# Patient Record
Sex: Female | Born: 2000 | Race: White | Hispanic: No | Smoking: Never smoker
Health system: Southern US, Community
[De-identification: ages and names within clinical notes are randomized; demographics above are authoritative.]

## PROBLEM LIST (undated history)

## (undated) DIAGNOSIS — D649 Anemia, unspecified: Secondary | ICD-10-CM

## (undated) HISTORY — DX: Anemia, unspecified: D64.9

---
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
## 2018-03-20 ENCOUNTER — Encounter (HOSPITAL_COMMUNITY): Payer: Self-pay | Admitting: Emergency Medicine

## 2018-03-20 ENCOUNTER — Emergency Department (HOSPITAL_COMMUNITY)
Admission: EM | Admit: 2018-03-20 | Discharge: 2018-03-20 | Disposition: A | Discharge status: Home / Self Care | Payer: Self-pay | Attending: Emergency Medicine | Admitting: Emergency Medicine

## 2018-03-20 DIAGNOSIS — M545 Low back pain, unspecified: Secondary | ICD-10-CM

## 2018-03-20 DIAGNOSIS — Z79899 Other long term (current) drug therapy: Secondary | ICD-10-CM | POA: Insufficient documentation

## 2018-03-20 MED ORDER — IBUPROFEN 800 MG PO TABS: 800.0000 mg | ORAL_TABLET | Freq: Three times a day (TID) | ORAL | 0 refills | Status: DC

## 2018-03-20 MED ORDER — METHOCARBAMOL 500 MG PO TABS: 500.0000 mg | ORAL_TABLET | Freq: Two times a day (BID) | ORAL | 0 refills | Status: DC

## 2018-03-20 NOTE — ED Provider Notes (Signed)
[facility] EMERGENCY DEPARTMENT Provider Note   CSN: [id number] Arrival date & time: 03/20/18  2005     History   Chief Complaint Chief Complaint  Patient presents with  . Back Pain    HPI Paige Fox is a 17 y.o. female.  The history is provided by the patient. No language interpreter was used.  Back Pain   This is a new problem. The current episode started more than 1 week ago. The problem occurs constantly. The problem has not changed since onset.The pain is present in the thoracic spine. The quality of the pain is described as aching. The pain is moderate. The pain is worse during the day. She has tried nothing for the symptoms.   Pt reports she fell 2 weeks ago.  Pt has had soreness in her back since.  History reviewed. No pertinent past medical history.  Patient Active Problem List   Diagnosis Date Noted  . VIRAL INFECTION, ACUTE 08/21/2009  . TONSILLITIS, ACUTE 08/21/2009  . ALLERGIC RHINITIS 06/24/2008    History reviewed. No pertinent surgical history.   OB History   None      Home Medications    Prior to Admission medications   Medication Sig Start Date End Date Taking? Authorizing Provider  cetirizine (ZYRTEC ALLERGY) 10 MG tablet Take 10 mg by mouth daily.    [provider]  ibuprofen (ADVIL,MOTRIN) 800 MG tablet Take 1 tablet (800 mg total) by mouth 3 (three) times daily. 03/20/18   Elson Areas, PA-C  methocarbamol (ROBAXIN) 500 MG tablet Take 1 tablet (500 mg total) by mouth 2 (two) times daily. 03/20/18   Elson Areas, PA-C    Family History History reviewed. No pertinent family history.  Social History Social History   Tobacco Use  . Smoking status: Never Smoker  . Smokeless tobacco: Never Used  Substance Use Topics  . Alcohol use: No  . Drug use: No     Allergies   Patient has no known allergies.   Review of Systems Review of Systems  Musculoskeletal: Positive for back pain.  All other systems reviewed and are  negative.    Physical Exam Updated Vital Signs BP 99/70 (BP Location: Right Arm)   Pulse 93   Temp 98.6 F (37 C) (Oral)   Resp 15   Ht 5\' 6"  (1.676 m)   Wt 95.3 kg   LMP 03/18/2018   SpO2 100%   BMI 33.89 kg/m   Physical Exam  Constitutional: She is oriented to person, place, and time. She appears well-developed and well-nourished.  HENT:  Head: Normocephalic.  Right Ear: External ear normal.  Left Ear: External ear normal.  Eyes: Pupils are equal, round, and reactive to light. EOM are normal.  Neck: Normal range of motion.  Cardiovascular: Normal rate.  Pulmonary/Chest: Effort normal.  Abdominal: Soft. She exhibits no distension.  Musculoskeletal: Normal range of motion.  Spine nontender, no bruising,  Pain lateral thoracic area,    Neurological: She is alert and oriented to person, place, and time.  Skin: Skin is warm.  Psychiatric: She has a normal mood and affect.  Nursing note and vitals reviewed.    ED Treatments / Results  Labs (all labs ordered are listed, but only abnormal results are displayed) Labs Reviewed - No data to display  EKG None  Radiology No results found.  Procedures Procedures (including critical care time)  Medications Ordered in ED Medications - No data to display   Initial Impression /  Assessment and Plan / ED Course  I have reviewed the triage vital signs and the nursing notes.  Pertinent labs & imaging results that were available during my care of the patient were reviewed by me and considered in my medical decision making (see chart for details).     MDM  I doubt fracture or spine injury.   Pt given rx for robaxin and ibuprofen for pain   Final Clinical Impressions(s) / ED Diagnoses   Final diagnoses:  Acute left-sided low back pain without sciatica    ED Discharge Orders         Ordered    methocarbamol (ROBAXIN) 500 MG tablet  2 times daily     03/20/18 2116    ibuprofen (ADVIL,MOTRIN) 800 MG tablet  3 times  daily     03/20/18 2116        An After Visit Summary was printed and given to the patient.    Elson Areas, [place] 03/20/18 2254    Maia Plan, MD 03/21/18 [phone]

## 2018-03-20 NOTE — ED Triage Notes (Signed)
Pt reports she fell on a wet floor a few weeks ago and is c/o upper back pain. Motrin at home with no relief.

## 2018-09-07 ENCOUNTER — Encounter: Payer: Self-pay | Admitting: Adult Health

## 2018-09-07 ENCOUNTER — Other Ambulatory Visit: Payer: Self-pay

## 2018-09-07 ENCOUNTER — Ambulatory Visit (INDEPENDENT_AMBULATORY_CARE_PROVIDER_SITE_OTHER): Payer: Medicaid Other | Admitting: Adult Health

## 2018-09-07 VITALS — BP 124/78 | HR 85 | Ht 66.0 in | Wt 232.0 lb

## 2018-09-07 DIAGNOSIS — Z30011 Encounter for initial prescription of contraceptive pills: Secondary | ICD-10-CM

## 2018-09-07 DIAGNOSIS — Z3202 Encounter for pregnancy test, result negative: Secondary | ICD-10-CM

## 2018-09-07 LAB — POCT URINE PREGNANCY: PREG TEST UR: NEGATIVE

## 2018-09-07 MED ORDER — NORETHIN ACE-ETH ESTRAD-FE 1-20 MG-MCG(24) PO CAPS
1.0000 | ORAL_CAPSULE | Freq: Every day | ORAL | 12 refills | Status: DC
Start: 2018-09-07 — End: 2019-01-11

## 2018-09-07 NOTE — Patient Instructions (Signed)
Oral Contraception Use  Oral contraceptive pills (OCPs) are medicines that you take to prevent pregnancy. OCPs work by:  · Preventing the ovaries from releasing eggs.  · Thickening mucus in the lower part of the uterus (cervix), which prevents sperm from entering the uterus.  · Thinning the lining of the uterus (endometrium), which prevents a fertilized egg from attaching to the endometrium.  OCPs are highly effective when taken exactly as prescribed. However, OCPs do not prevent sexually transmitted infections (STIs). Safe sex practices, such as using condoms while on an OCP, can help prevent STIs.  Before taking OCPs, you may have a physical exam, blood test, and Pap test. A Pap test involves taking a sample of cells from your cervix to check for cancer. Discuss with your health care provider the possible side effects of the OCP you may be prescribed. When you start an OCP, be aware that it can take 2-3 months for your body to adjust to changes in hormone levels.  How to take oral contraceptive pills  Follow instructions from your health care provider about how to start taking your first cycle of OCPs. Your health care provider may recommend that you:  · Start the pill on day 1 of your menstrual period. If you start at this time, you will not need any backup form of birth control (contraception), such as condoms.  · Start the pill on the first Sunday after your menstrual period or on the day you get your prescription. In these cases, you will need to use backup contraception for the first week.  · Start the pill at any time of your cycle.  ? If you take the pill within 5 days of the start of your period, you will not need a backup form of contraception.  ? If you start at any other time of your menstrual cycle, you will need to use another form of contraception for 7 days. If your OCP is the type called a minipill, it will protect you from pregnancy after taking it for 2 days (48 hours), and you can stop using  backup contraception after that time.  After you have started taking OCPs:  · If you forget to take 1 pill, take it as soon as you remember. Take the next pill at the regular time.  · If you miss 2 or more pills, call your health care provider. Different pills have different instructions for missed doses. Use backup birth control until your next menstrual period starts.  · If you use a 28-day pack that contains inactive pills and you miss 1 of the last 7 pills (pills with no hormones), throw away the rest of the non-hormone pills and start a new pill pack.  No matter which day you start the OCP, you will always start a new pack on that same day of the week. Have an extra pack of OCPs and a backup contraceptive method available in case you miss some pills or lose your OCP pack.  Follow these instructions at home:  · Do not use any products that contain nicotine or tobacco, such as cigarettes and e-cigarettes. If you need help quitting, ask your health care provider.  · Always use a condom to protect against STIs. OCPs do not protect against STIs.  · Use a calendar to mark the days of your menstrual period.  · Read the information and directions that came with your OCP. Talk to your health care provider if you have questions.  Contact a   health care provider if:  · You develop nausea and vomiting.  · You have abnormal vaginal discharge or bleeding.  · You develop a rash.  · You miss your menstrual period. Depending on the type of OCP you are taking, this may be a sign of pregnancy. Ask your health care provider for more information.  · You are losing your hair.  · You need treatment for mood swings or depression.  · You get dizzy when taking the OCP.  · You develop acne after taking the OCP.  · You become pregnant or think you may be pregnant.  · You have diarrhea, constipation, and abdominal pain or cramps.  · You miss 2 or more pills.  Get help right away if:  · You develop chest pain.  · You develop shortness of  breath.  · You have an uncontrolled or severe headache.  · You develop numbness or slurred speech.  · You develop visual or speech problems.  · You develop pain, redness, and swelling in your legs.  · You develop weakness or numbness in your arms or legs.  Summary  · Oral contraceptive pills (OCPs) are medicines that you take to prevent pregnancy.  · OCPs do not prevent sexually transmitted infections (STIs). Always use a condom to protect against STIs.  · When you start an OCP, be aware that it can take 2-3 months for your body to adjust to changes in hormone levels.  · Read all the information and directions that come with your OCP.  This information is not intended to replace advice given to you by your health care provider. Make sure you discuss any questions you have with your health care provider.  Document Released: 06/23/2011 Document Revised: 08/15/2016 Document Reviewed: 08/15/2016  Elsevier Interactive Patient Education © 2019 [employer] Inc[employer]

## 2018-09-07 NOTE — Progress Notes (Signed)
Patient ID: Paige Fox, female   DOB: January 18, 2001, 18 y.o.   MRN: [id number] History of Present Illness: Paige Fox is a 18 year old Hispanic female in for UPT, she was eating a lot and had + and negative HPT, and her mom wanted another UPT. PCP is [facility]. In [place].    Current Medications, Allergies, Past Medical History, Past Surgical History, Family History and Social History were reviewed in [employer] record.     Review of Systems:  Patient denies any daily. headaches, hearing loss, fatigue, blurred vision, shortness of breath, chest pain, abdominal pain, problems with bowel movements, urination, or intercourse. No joint pain or mood swings. Periods last 5 days, first couple heavy, occasional cramps, changes pads every 6 hours.   Physical Exam:BP 124/78 (BP Location: Right Arm, Patient Position: Sitting, Cuff Size: Large)   Pulse 85   Ht 5\' 6"  (1.676 m)   Wt 232 lb (105.2 kg)   LMP 08/22/2018 (Approximate)   BMI 37.45 kg/m   UPT negative. General:  Well developed, well nourished, no acute distress Skin:  Warm and dry Neck:  Midline trachea, normal thyroid, good ROM, no lymphadenopathy Lungs; Clear to auscultation bilaterally Cardiovascular: Regular rate and rhythm Psych:  No mood changes, alert and cooperative,seems happy Fall risk is low. PHQ 2 score 1. Denies migraines with aura, DVT, stroke, MI, or breast cancer. Discussed OCs and depo, and nexplanon, ring and IUD and wants to try OCs, has use depo in past, with weight gain. Will rx Taytulla, to start today,1 pack given, take at   Impression:  1. Encounter for initial prescription of contraceptive pills   2. Negative pregnancy test      Plan: Meds ordered this encounter  Medications  . Norethin Ace-Eth Estrad-FE (TAYTULLA) 1-20 MG-MCG(24) CAPS    Sig: Take 1 tablet by mouth daily.    Dispense:  28 capsule    Refill:  12    Order Specific Question:   Supervising Provider    Answer:    Lazaro Arms [[id number]]  Start today Use condoms F/U with me in 3 months  Review handout on OC use

## 2018-11-23 ENCOUNTER — Telehealth: Payer: Self-pay | Admitting: *Deleted

## 2018-11-23 NOTE — Telephone Encounter (Signed)
Left message at both numbers listed letting pt know no visitors or children at [facility] appt. Also, if pt has come in contact with someone in the last month that has been confirmed or suspected of having Covid-19 or if she is experiencing any symptoms herself, please don't come to the office; just call and appt can be rescheduled. JSY

## 2018-11-26 ENCOUNTER — Ambulatory Visit: Payer: Self-pay | Admitting: Adult Health

## 2018-11-29 ENCOUNTER — Other Ambulatory Visit: Payer: Self-pay

## 2018-11-29 ENCOUNTER — Ambulatory Visit (INDEPENDENT_AMBULATORY_CARE_PROVIDER_SITE_OTHER): Payer: Medicaid Other | Admitting: Adult Health

## 2018-11-29 ENCOUNTER — Encounter: Payer: Self-pay | Admitting: Adult Health

## 2018-11-29 VITALS — BP 137/80 | HR 86 | Temp 98.5°F | Ht 66.0 in | Wt 230.6 lb

## 2018-11-29 DIAGNOSIS — Z3041 Encounter for surveillance of contraceptive pills: Secondary | ICD-10-CM | POA: Diagnosis not present

## 2018-11-29 NOTE — Progress Notes (Signed)
Patient ID: Paige Fox, female   DOB: [birth year], 18 y.o.   MRN: [id number] History of Present Illness: Paige Fox is a 18 year old white female, single,G0P0 back in follow up on starting Taytulla in February and she likes them, periods shorter and lighter. She is still working at [employer].  PCP is [facility] in [place].    Current Medications, Allergies, Past Medical History, Past Surgical History, Family History and Social History were reviewed in [employer] record.     Review of Systems: Patient denies any headaches, hearing loss, fatigue, blurred vision, shortness of breath, chest pain, abdominal pain, problems with bowel movements, urination, or intercourse. No joint pain or mood swings. Periods are shorter and lighter.     Physical Exam:BP 137/80 (BP Location: Right Arm, Patient Position: Sitting, Cuff Size: Normal)   Pulse 86   Temp 98.5 F (36.9 C)   Ht 5\' 6"  (1.676 m)   Wt 230 lb 9.6 oz (104.6 kg)   LMP 11/16/2018   BMI 37.22 kg/m  General:  Well developed, well nourished, no acute distress Skin:  Warm and dry Lungs; Clear to auscultation bilaterally Cardiovascular: Regular rate and rhythm Psych:  No mood changes, alert and cooperative,seems happy Fall risk is low. Will continue Taytulla.    Impression and Plan: 1. Encounter for surveillance of contraceptive pills -continue Taytulla, has refills -use condoms Follow  Up in February 2021

## 2019-01-11 ENCOUNTER — Other Ambulatory Visit: Payer: Self-pay | Admitting: Adult Health

## 2019-07-30 DIAGNOSIS — Z20822 Contact with and (suspected) exposure to covid-19: Secondary | ICD-10-CM | POA: Diagnosis not present

## 2020-01-27 ENCOUNTER — Other Ambulatory Visit: Payer: Self-pay | Admitting: Adult Health

## 2020-04-21 ENCOUNTER — Encounter: Payer: Self-pay | Admitting: Adult Health

## 2020-04-21 ENCOUNTER — Ambulatory Visit (INDEPENDENT_AMBULATORY_CARE_PROVIDER_SITE_OTHER): Payer: Medicaid Other | Admitting: Adult Health

## 2020-04-21 VITALS — BP 130/91 | HR 115 | Ht 66.0 in | Wt 264.0 lb

## 2020-04-21 DIAGNOSIS — Z30011 Encounter for initial prescription of contraceptive pills: Secondary | ICD-10-CM | POA: Diagnosis not present

## 2020-04-21 DIAGNOSIS — Z3202 Encounter for pregnancy test, result negative: Secondary | ICD-10-CM | POA: Diagnosis not present

## 2020-04-21 LAB — POCT URINE PREGNANCY: Preg Test, Ur: NEGATIVE

## 2020-04-21 MED ORDER — LO LOESTRIN FE 1 MG-10 MCG / 10 MCG PO TABS: 1.0000 | ORAL_TABLET | Freq: Every day | ORAL | 11 refills | Status: DC

## 2020-04-21 NOTE — Progress Notes (Signed)
°  Subjective:     Patient ID: Paige Fox, female   DOB: May 26, 2001, 19 y.o.   MRN: [id number]  HPI Paige Fox is a 19 year old white female, single, G0P0, in to discuss getting back on birth control pills.   Review of Systems Patient denies any headaches, hearing loss, fatigue, blurred vision, shortness of breath, chest pain, abdominal pain, problems with bowel movements, urination, or intercourse. No joint pain or mood swings. Reviewed past medical,surgical, social and family history. Reviewed medications and allergies.     Objective:   Physical Exam BP (!) 130/91 (BP Location: Left Wrist, Patient Position: Sitting, Cuff Size: Normal)    Pulse (!) 115    Ht 5\' 6"  (1.676 m)    Wt 264 lb (119.7 kg)    LMP 03/26/2020 (Exact Date)    BMI 42.61 kg/m UPT is negative Skin warm and dry. Neck: mid line trachea, normal thyroid, good ROM, no lymphadenopathy noted. Lungs: clear to ausculation bilaterally. Cardiovascular: regular rate and rhythm.  Upstream - 04/21/20 1214      Pregnancy Intention Screening   Does the patient want to become pregnant in the next year? No    Does the patient's partner want to become pregnant in the next year? No    Would the patient like to discuss contraceptive options today? Yes      Contraception Wrap Up   Current Method No Contraceptive Precautions    End Method Oral Contraceptive    Contraception Counseling Provided No             Assessment:     1. Urine pregnancy test negative   2. Encounter for initial prescription of contraceptive pills Start lo loestrin with next period and use back up for 1 pack Meds ordered this encounter  Medications   Norethindrone-Ethinyl Estradiol-Fe Biphas (LO LOESTRIN FE) 1 MG-10 MCG / 10 MCG tablet    Sig: Take 1 tablet by mouth daily. Take 1 daily by mouth    Dispense:  28 tablet    Refill:  11    BIN 06/21/20, PCN CN, GRP [id number]    Order Specific Question:   Supervising Provider    Answer:   [id number] [[id number]]      Plan:     Return in about 10 weeks for ROS and pap and physical with me

## 2020-06-29 ENCOUNTER — Other Ambulatory Visit: Payer: Medicaid Other | Admitting: Adult Health

## 2020-08-13 ENCOUNTER — Other Ambulatory Visit: Payer: Medicaid Other | Admitting: Adult Health

## 2020-09-24 ENCOUNTER — Other Ambulatory Visit: Payer: Medicaid Other | Admitting: Adult Health

## 2021-01-12 ENCOUNTER — Ambulatory Visit
Admission: EM | Admit: 2021-01-12 | Discharge: 2021-01-12 | Disposition: A | Discharge status: Home / Self Care | Payer: Medicaid Other | Attending: Physician Assistant | Admitting: Physician Assistant

## 2021-01-12 ENCOUNTER — Other Ambulatory Visit: Payer: Self-pay

## 2021-01-12 ENCOUNTER — Encounter: Payer: Self-pay | Admitting: Emergency Medicine

## 2021-01-12 DIAGNOSIS — R42 Dizziness and giddiness: Secondary | ICD-10-CM

## 2021-01-12 LAB — POCT FASTING CBG KUC MANUAL ENTRY: POCT Glucose (KUC): 116 mg/dL — AB (ref 70–99)

## 2021-01-12 NOTE — ED Triage Notes (Signed)
Pt was having dizzy spells yesterday at work constantly.  State she has had trouble with dizziness since middle school.

## 2021-01-12 NOTE — Discharge Instructions (Addendum)
Buy a multivitamin with iron  Your labs are pending

## 2021-01-13 LAB — CBC WITH DIFFERENTIAL/PLATELET
Basophils Absolute: 0.1 x10E3/uL (ref 0.0–0.2)
Basos: 0 %
EOS (ABSOLUTE): 0.1 x10E3/uL (ref 0.0–0.4)
Eos: 1 %
Hematocrit: 41.6 % (ref 34.0–46.6)
Hemoglobin: 14 g/dL (ref 11.1–15.9)
Immature Grans (Abs): 0 x10E3/uL (ref 0.0–0.1)
Immature Granulocytes: 0 %
Lymphocytes Absolute: 2.8 x10E3/uL (ref 0.7–3.1)
Lymphs: 22 %
MCH: 29 pg (ref 26.6–33.0)
MCHC: 33.7 g/dL (ref 31.5–35.7)
MCV: 86 fL (ref 79–97)
Monocytes Absolute: 1 x10E3/uL — ABNORMAL HIGH (ref 0.1–0.9)
Monocytes: 8 %
Neutrophils Absolute: 8.5 x10E3/uL — ABNORMAL HIGH (ref 1.4–7.0)
Neutrophils: 69 %
Platelets: 414 x10E3/uL (ref 150–450)
RBC: 4.82 x10E6/uL (ref 3.77–5.28)
RDW: 12.1 % (ref 11.7–15.4)
WBC: 12.4 x10E3/uL — ABNORMAL HIGH (ref 3.4–10.8)

## 2021-01-13 LAB — BASIC METABOLIC PANEL WITH GFR
BUN/Creatinine Ratio: 11 (ref 9–23)
BUN: 9 mg/dL (ref 6–20)
CO2: 22 mmol/L (ref 20–29)
Calcium: 9.8 mg/dL (ref 8.7–10.2)
Chloride: 102 mmol/L (ref 96–106)
Creatinine, Ser: 0.81 mg/dL (ref 0.57–1.00)
Glucose: 87 mg/dL (ref 65–99)
Potassium: 4.4 mmol/L (ref 3.5–5.2)
Sodium: 142 mmol/L (ref 134–144)
eGFR: 105 mL/min/1.73

## 2021-01-15 NOTE — ED Provider Notes (Addendum)
[facility]    CSN: [id number] Arrival date & time: 01/12/21  1418      History   Chief Complaint Chief Complaint  Patient presents with   Dizziness    HPI Paige Fox is a 20 y.o. female.   The history is provided by the patient. No language interpreter was used.  Dizziness Quality:  Lightheadedness Severity:  Moderate Onset quality:  Gradual Duration:  1 week Chronicity:  New Relieved by:  Nothing Worsened by:  Nothing Ineffective treatments:  None tried Associated symptoms: no nausea   Risk factors: anemia   Pt has a history of anemia.  Pt reports she has recently been feeling dizzy.  Pt has had this in the past  History reviewed. No pertinent past medical history.  Patient Active Problem List   Diagnosis Date Noted   Encounter for initial prescription of contraceptive pills 04/21/2020   Urine pregnancy test negative 04/21/2020   VIRAL INFECTION, ACUTE 08/21/2009   TONSILLITIS, ACUTE 08/21/2009   ALLERGIC RHINITIS 06/24/2008    History reviewed. No pertinent surgical history.  OB History     Gravida  0   Para  0   Term  0   Preterm  0   AB  0   Living  0      SAB  0   IAB  0   Ectopic  0   Multiple  0   Live Births  0            Home Medications    Prior to Admission medications   Medication Sig Start Date End Date Taking? Authorizing Provider  cetirizine (ZYRTEC ALLERGY) 10 MG tablet Take 10 mg by mouth daily.    [provider]  ibuprofen (ADVIL,MOTRIN) 800 MG tablet Take 1 tablet (800 mg total) by mouth 3 (three) times daily. 03/20/18   Elson Areas, PA-C  Norethindrone-Ethinyl Estradiol-Fe Biphas (LO LOESTRIN FE) 1 MG-10 MCG / 10 MCG tablet Take 1 tablet by mouth daily. Take 1 daily by mouth 04/21/20   Adline Potter, NP    Family History Family History  Problem Relation Age of Onset   Hypertension Maternal Grandmother    Heart disease Maternal Grandmother    Diabetes Mother    Cancer Mother      Social History Social History   Tobacco Use   Smoking status: Never   Smokeless tobacco: Never  Vaping Use   Vaping Use: Never used  Substance Use Topics   Alcohol use: No   Drug use: No     Allergies   Patient has no known allergies.   Review of Systems Review of Systems  Gastrointestinal:  Negative for nausea.  Neurological:  Positive for dizziness.  All other systems reviewed and are negative.   Physical Exam Triage Vital Signs ED Triage Vitals [01/12/21 1449]  Enc Vitals Group     BP 139/89     Pulse Rate 97     Resp 19     Temp 98.8 F (37.1 C)     Temp Source Oral     SpO2 96 %     Weight      Height      Head Circumference      Peak Flow      Pain Score 0     Pain Loc      Pain Edu?      Excl. in GC?    No data found.  Updated Vital Signs BP  139/89 (BP Location: Right Arm)   Pulse 97   Temp 98.8 F (37.1 C) (Oral)   Resp 19   LMP 12/06/2020   SpO2 96%   Visual Acuity Right Eye Distance:   Left Eye Distance:   Bilateral Distance:    Right Eye Near:   Left Eye Near:    Bilateral Near:     Physical Exam Vitals and nursing note reviewed.  Constitutional:      Appearance: She is well-developed.  HENT:     Head: Normocephalic.     Nose: Nose normal.     Mouth/Throat:     Mouth: Mucous membranes are moist.  Cardiovascular:     Rate and Rhythm: Normal rate and regular rhythm.  Pulmonary:     Effort: Pulmonary effort is normal.  Abdominal:     General: Abdomen is flat. There is no distension.  Musculoskeletal:        General: Normal range of motion.     Cervical back: Normal range of motion.  Skin:    General: Skin is warm.  Neurological:     General: No focal deficit present.     Mental Status: She is alert and oriented to person, place, and time.  Psychiatric:        Mood and Affect: Mood normal.     UC Treatments / Results  Labs (all labs ordered are listed, but only abnormal results are displayed) Labs Reviewed   CBC WITH DIFFERENTIAL/PLATELET - Abnormal; Notable for the following components:      Result Value   WBC 12.4 (*)    Neutrophils Absolute 8.5 (*)    Monocytes Absolute 1.0 (*)    All other components within normal limits   Narrative:    Performed at:  [address] [place], [place]  [id number] Lab Director: Jolene Schimke MD, Phone:  [phone]  POCT FASTING CBG [facility] MANUAL ENTRY - Abnormal; Notable for the following components:   POCT Glucose ([facility]) 116 (*)    All other components within normal limits  BASIC METABOLIC PANEL   Narrative:    Performed at:  [address], [place], [place]  [id number] Lab Director: Jolene Schimke MD, Phone:  [phone]    EKG   Radiology No results found.  Procedures Procedures (including critical care time)  Medications Ordered in UC Medications - No data to display  Initial Impression / Assessment and Plan / UC Course  I have reviewed the triage vital signs and the nursing notes.  Pertinent labs & imaging results that were available during my care of the patient were reviewed by me and considered in my medical decision making (see chart for details).     MDM;  I will send labs.  Pt advised to start multivitamin with iron.  Follow up with primary care MD>  Increase fluids  Labs reviewed,  o need for further treatment  Final Clinical Impressions(s) / UC Diagnoses   Final diagnoses:  Dizziness and giddiness     Discharge Instructions      Buy a multivitamin with iron  Your labs are pending    ED Prescriptions   None    PDMP not reviewed this encounter. An After Visit Summary was printed and given to the patient.    Elson Areas, PA-C 01/15/21 1617    Elson Areas, [place] 01/20/21 1958

## 2021-01-16 ENCOUNTER — Ambulatory Visit (INDEPENDENT_AMBULATORY_CARE_PROVIDER_SITE_OTHER): Payer: Medicaid Other

## 2021-01-16 ENCOUNTER — Ambulatory Visit
Admission: EM | Admit: 2021-01-16 | Discharge: 2021-01-16 | Disposition: A | Discharge status: Home / Self Care | Payer: Medicaid Other | Attending: Emergency Medicine | Admitting: Emergency Medicine

## 2021-01-16 ENCOUNTER — Other Ambulatory Visit: Payer: Self-pay

## 2021-01-16 DIAGNOSIS — M25561 Pain in right knee: Secondary | ICD-10-CM

## 2021-01-16 DIAGNOSIS — S8991XA Unspecified injury of right lower leg, initial encounter: Secondary | ICD-10-CM | POA: Diagnosis not present

## 2021-01-16 MED ORDER — NAPROXEN 500 MG PO TABS: 500.0000 mg | ORAL_TABLET | Freq: Two times a day (BID) | ORAL | 0 refills | Status: DC

## 2021-01-16 NOTE — ED Provider Notes (Signed)
[facility]   [id number] 01/16/21 Arrival Time: 1418  CC: RT knee PAIN  SUBJECTIVE: History from: patient. Paige Fox is a 20 y.o. female complains of RT knee pain x 1 day  States she was pivoting and felt pop in knee.  Localizes the pain to the front of knee.  Describes the pain as intermittent.  Denies alleviating factors.  Symptoms are made worse with bending knee.  Denies similar symptoms in the past.  Denies fever, chills, erythema, ecchymosis, effusion, weakness, numbness and tingling.  ROS: As per HPI.  All other pertinent ROS negative.     History reviewed. No pertinent past medical history. History reviewed. No pertinent surgical history. No Known Allergies No current facility-administered medications on file prior to encounter.   Current Outpatient Medications on File Prior to Encounter  Medication Sig Dispense Refill   ibuprofen (ADVIL,MOTRIN) 800 MG tablet Take 1 tablet (800 mg total) by mouth 3 (three) times daily. 21 tablet 0   Norethindrone-Ethinyl Estradiol-Fe Biphas (LO LOESTRIN FE) 1 MG-10 MCG / 10 MCG tablet Take 1 tablet by mouth daily. Take 1 daily by mouth 28 tablet 11   Social History   Socioeconomic History   Marital status: Single    Spouse name: Not on file   Number of children: 0   Years of education: Not on file   Highest education level: Not on file  Occupational History   Not on file  Tobacco Use   Smoking status: Never   Smokeless tobacco: Never  Vaping Use   Vaping Use: Never used  Substance and Sexual Activity   Alcohol use: No   Drug use: No   Sexual activity: Yes    Birth control/protection: None  Other Topics Concern   Not on file  Social History Narrative   Not on file   Social Determinants of Health   Financial Resource Strain: Not on file  Food Insecurity: Not on file  Transportation Needs: Not on file  Physical Activity: Not on file  Stress: Not on file  Social Connections: Not on file  Intimate Partner Violence:  Not on file   Family History  Problem Relation Age of Onset   Hypertension Maternal Grandmother    Heart disease Maternal Grandmother    Diabetes Mother    Cancer Mother     OBJECTIVE:  Vitals:   01/16/21 1424  BP: 134/86  Pulse: (!) 113  Resp: 16  Temp: 98.5 F (36.9 C)  TempSrc: Oral  SpO2: 95%    General appearance: ALERT; in no acute distress.  Head: NCAT Lungs: Normal respiratory effort Musculoskeletal: RT knee Inspection: Skin warm, dry, clear and intact without obvious erythema, effusion, or ecchymosis.  Palpation: mildly TTP over medial aspect of knee ROM: LROM Strength: 5/5 knee flexion, 5/5 knee extension Negative anterior/ posterior drawer Skin: warm and dry Neurologic: Ambulates without difficulty; Sensation intact about the lower extremities Psychological: alert and cooperative; normal mood and affect  DIAGNOSTIC STUDIES:  DG Knee Complete 4 Views Right  Result Date: 01/16/2021 CLINICAL DATA:  Knee injury.  Heard a pop sound. EXAM: RIGHT KNEE - COMPLETE 4+ VIEW COMPARISON:  09/11/2016 FINDINGS: No evidence of fracture, dislocation, or joint effusion. No evidence of arthropathy or other focal bone abnormality. Soft tissues are unremarkable. IMPRESSION: Negative. Electronically Signed   By: Norva Pavlov M.D.   On: 01/16/2021 14:45     X-rays negative for bony abnormalities including fracture, or dislocation.  No soft tissue swelling.    I  have reviewed the x-rays myself and the radiologist interpretation. I am in agreement with the radiologist interpretation.     ASSESSMENT & PLAN:  1. Acute pain of right knee     Meds ordered this encounter  Medications   naproxen (NAPROSYN) 500 MG tablet    Sig: Take 1 tablet (500 mg total) by mouth 2 (two) times daily.    Dispense:  30 tablet    Refill:  0    Order Specific Question:   Supervising Provider    Answer:   Eustace Moore [[id number]]   X-rays negative Ace applied Continue conservative  management of rest, ice, elevation, and gentle stretches Take naproxen as needed for pain relief (may cause abdominal discomfort, ulcers, and GI bleeds avoid taking with other NSAIDs) Follow up with PCP if symptoms persist Return or go to the ER if you have any new or worsening symptoms (fever, chills, chest pain, redness, swelling, deformity, etc...)   Reviewed expectations re: course of current medical issues. Questions answered. Outlined signs and symptoms indicating need for more acute intervention. Patient verbalized understanding. After Visit Summary given.     Rennis Harding, PA-C 01/16/21 1458

## 2021-01-16 NOTE — ED Triage Notes (Signed)
Patient presents to Urgent Care with complaints of a right knee injury. She states she was cleaning her room and heard a pop sound. Reports pain increases with movement.

## 2021-01-16 NOTE — Discharge Instructions (Addendum)
X-rays negative Ace applied Continue conservative management of rest, ice, elevation, and gentle stretches Take naproxen as needed for pain relief (may cause abdominal discomfort, ulcers, and GI bleeds avoid taking with other NSAIDs) Follow up with PCP if symptoms persist Return or go to the ER if you have any new or worsening symptoms (fever, chills, chest pain, redness, swelling, deformity, etc...)

## 2021-02-11 ENCOUNTER — Ambulatory Visit
Admission: EM | Admit: 2021-02-11 | Discharge: 2021-02-11 | Disposition: A | Discharge status: Home / Self Care | Payer: Medicaid Other | Attending: Emergency Medicine | Admitting: Emergency Medicine

## 2021-02-11 ENCOUNTER — Other Ambulatory Visit: Payer: Self-pay

## 2021-02-11 DIAGNOSIS — R21 Rash and other nonspecific skin eruption: Secondary | ICD-10-CM

## 2021-02-11 DIAGNOSIS — T7840XA Allergy, unspecified, initial encounter: Secondary | ICD-10-CM

## 2021-02-11 MED ORDER — TRIAMCINOLONE ACETONIDE 0.1 % EX CREA: 1.0000 "application " | TOPICAL_CREAM | Freq: Two times a day (BID) | CUTANEOUS | 0 refills | Status: DC

## 2021-02-11 NOTE — ED Provider Notes (Signed)
[facility]   [id number] 02/11/21 Arrival Time: 1431  CC: SKIN COMPLAINT  SUBJECTIVE:  Paige Fox is a 20 y.o. female who presents with a rash around neck x 1-2 weeks.  Denies precipitating event or trauma.  Denies changes in soaps, detergents, close contacts with similar rash, known trigger or environmental trigger, allergy. Does admit to wearing a sterling silver necklace with low dose nickel.  Took off 1-2 days ago.  Localizes the rash to the neck.  Describes it as painful, itchy, and burning.  Denies alleviating factors  She denies aggravating factors.  Reports similar symptoms in the past with nickel.   Denies fever, chills, nausea, vomiting.  ROS: As per HPI.  All other pertinent ROS negative.     History reviewed. No pertinent past medical history. History reviewed. No pertinent surgical history. No Known Allergies No current facility-administered medications on file prior to encounter.   Current Outpatient Medications on File Prior to Encounter  Medication Sig Dispense Refill   ibuprofen (ADVIL,MOTRIN) 800 MG tablet Take 1 tablet (800 mg total) by mouth 3 (three) times daily. 21 tablet 0   naproxen (NAPROSYN) 500 MG tablet Take 1 tablet (500 mg total) by mouth 2 (two) times daily. 30 tablet 0   Norethindrone-Ethinyl Estradiol-Fe Biphas (LO LOESTRIN FE) 1 MG-10 MCG / 10 MCG tablet Take 1 tablet by mouth daily. Take 1 daily by mouth 28 tablet 11   Social History   Socioeconomic History   Marital status: Single    Spouse name: Not on file   Number of children: 0   Years of education: Not on file   Highest education level: Not on file  Occupational History   Not on file  Tobacco Use   Smoking status: Never   Smokeless tobacco: Never  Vaping Use   Vaping Use: Never used  Substance and Sexual Activity   Alcohol use: No   Drug use: No   Sexual activity: Yes    Birth control/protection: None  Other Topics Concern   Not on file  Social History Narrative   Not  on file   Social Determinants of Health   Financial Resource Strain: Not on file  Food Insecurity: Not on file  Transportation Needs: Not on file  Physical Activity: Not on file  Stress: Not on file  Social Connections: Not on file  Intimate Partner Violence: Not on file   Family History  Problem Relation Age of Onset   Hypertension Maternal Grandmother    Heart disease Maternal Grandmother    Diabetes Mother    Cancer Mother     OBJECTIVE: Vitals:   02/11/21 1441 02/11/21 1443  BP:  117/77  Pulse:  (!) 102  Temp:  98.5 F (36.9 C)  SpO2:  96%  Weight: 263 lb (119.3 kg)   Height: 5\' 2"  (1.575 m)     General appearance: alert; no distress Head: NCAT Lungs: normal respiratory effort Extremities: no edema Skin: warm and dry; erythematous papular rash to circumference of neck, NTTP, no active drainage or bleeding Psychological: alert and cooperative; normal mood and affect  ASSESSMENT & PLAN:  1. Rash of neck   2. Allergic reaction, initial encounter     Meds ordered this encounter  Medications   triamcinolone cream (KENALOG) 0.1 %    Sig: Apply 1 application topically 2 (two) times daily.    Dispense:  30 g    Refill:  0    Order Specific Question:   Supervising Provider  AnswerEustace Moore [[id number]]    @[facility] @  Prescribed triamcinolone use as directed Follow up with PCP for recheck Return or go to the ER if you have any new or worsening symptoms such as fever, chills, nausea, vomiting, redness, swelling, discharge, if symptoms do not improve with medications, etc...  Reviewed expectations re: course of current medical issues. Questions answered. Outlined signs and symptoms indicating need for more acute intervention. Patient verbalized understanding. After Visit Summary given.    , PA-C 02/11/21 [phone]

## 2021-02-11 NOTE — Discharge Instructions (Addendum)
Prescribed triamcinolone use as directed Follow up with PCP for recheck Return or go to the ER if you have any new or worsening symptoms such as fever, chills, nausea, vomiting, redness, swelling, discharge, if symptoms do not improve with medications, etc..Marland Kitchen

## 2021-02-11 NOTE — ED Triage Notes (Signed)
Pt states that she has a rash that goes around her neck that burns and itches. X1 week

## 2021-02-22 ENCOUNTER — Other Ambulatory Visit: Payer: Self-pay

## 2021-02-22 ENCOUNTER — Ambulatory Visit
Admission: EM | Admit: 2021-02-22 | Discharge: 2021-02-22 | Disposition: A | Discharge status: Home / Self Care | Payer: Medicaid Other | Attending: Family Medicine | Admitting: Family Medicine

## 2021-02-22 ENCOUNTER — Encounter: Payer: Self-pay | Admitting: Emergency Medicine

## 2021-02-22 DIAGNOSIS — B349 Viral infection, unspecified: Secondary | ICD-10-CM

## 2021-02-22 DIAGNOSIS — Z20822 Contact with and (suspected) exposure to covid-19: Secondary | ICD-10-CM | POA: Diagnosis not present

## 2021-02-22 MED ORDER — PROMETHAZINE-DM 6.25-15 MG/5ML PO SYRP: 5.0000 mL | ORAL_SOLUTION | Freq: Three times a day (TID) | ORAL | 0 refills | Status: DC | PRN

## 2021-02-22 NOTE — ED Provider Notes (Signed)
[facility]    CSN: [id number] Arrival date & time: 02/22/21  1253      History   Chief Complaint No chief complaint on file.   HPI Paige Fox is a 20 y.o. female.   HPI Patient presents today for evaluation of sore throat, subjective fever, headache and body aches x1 day ago.  She denies any known exposure to anyone positive for COVID.  Denies any shortness of breath. Take OTC ibuprofen and Flu cold medication without relief.   History reviewed. No pertinent past medical history.  Patient Active Problem List   Diagnosis Date Noted   Encounter for initial prescription of contraceptive pills 04/21/2020   Urine pregnancy test negative 04/21/2020   VIRAL INFECTION, ACUTE 08/21/2009   TONSILLITIS, ACUTE 08/21/2009   ALLERGIC RHINITIS 06/24/2008    History reviewed. No pertinent surgical history.  OB History     Gravida  0   Para  0   Term  0   Preterm  0   AB  0   Living  0      SAB  0   IAB  0   Ectopic  0   Multiple  0   Live Births  0            Home Medications    Prior to Admission medications   Medication Sig Start Date End Date Taking? Authorizing Provider  promethazine-dextromethorphan (PROMETHAZINE-DM) 6.25-15 MG/5ML syrup Take 5 mLs by mouth 3 (three) times daily as needed for cough. 02/22/21  Yes Bing Neighbors, FNP  ibuprofen (ADVIL,MOTRIN) 800 MG tablet Take 1 tablet (800 mg total) by mouth 3 (three) times daily. 03/20/18   Elson Areas, PA-C  naproxen (NAPROSYN) 500 MG tablet Take 1 tablet (500 mg total) by mouth 2 (two) times daily. 01/16/21   Wurst, [place], PA-C  Norethindrone-Ethinyl Estradiol-Fe Biphas (LO LOESTRIN FE) 1 MG-10 MCG / 10 MCG tablet Take 1 tablet by mouth daily. Take 1 daily by mouth 04/21/20   Cyril Mourning A, NP  triamcinolone cream (KENALOG) 0.1 % Apply 1 application topically 2 (two) times daily. 02/11/21   Rennis Harding, PA-C    Family History Family History  Problem Relation Age of Onset    Hypertension Maternal Grandmother    Heart disease Maternal Grandmother    Diabetes Mother    Cancer Mother     Social History Social History   Tobacco Use   Smoking status: Never   Smokeless tobacco: Never  Vaping Use   Vaping Use: Never used  Substance Use Topics   Alcohol use: No   Drug use: No     Allergies   Patient has no known allergies.   Review of Systems Review of Systems Pertinent negatives listed in HPI   Physical Exam Triage Vital Signs ED Triage Vitals  Enc Vitals Group     BP 02/22/21 1324 115/84     Pulse Rate 02/22/21 1324 (!) 102     Resp 02/22/21 1324 16     Temp 02/22/21 1324 98.6 F (37 C)     Temp Source 02/22/21 1324 Oral     SpO2 02/22/21 1324 96 %     Weight --      Height --      Head Circumference --      Peak Flow --      Pain Score 02/22/21 1325 0     Pain Loc --      Pain Edu? --  Excl. in GC? --    No data found.  Updated Vital Signs BP 115/84 (BP Location: Right Arm)   Pulse (!) 102   Temp 98.6 F (37 C) (Oral)   Resp 16   LMP 02/16/2021 (Exact Date)   SpO2 96%   Visual Acuity Right Eye Distance:   Left Eye Distance:   Bilateral Distance:    Right Eye Near:   Left Eye Near:    Bilateral Near:     Physical Exam   General Appearance:    Alert, cooperative, no distress  HENT:   Normocephalic, ears normal, nares mucosal edema with congestion, rhinorrhea, oropharynx    Eyes:    PERRL, conjunctiva/corneas clear, EOM's intact       Lungs:     Clear to auscultation bilaterally, respirations unlabored  Heart:    Regular rate and rhythm  Neurologic:   Awake, alert, oriented x 3. No apparent focal neurological           defect.      UC Treatments / Results  Labs (all labs ordered are listed, but only abnormal results are displayed) Labs Reviewed  NOVEL CORONAVIRUS, NAA    EKG   Radiology No results found.  Procedures Procedures (including critical care time)  Medications Ordered in  UC Medications - No data to display  Initial Impression / Assessment and Plan / UC Course  I have reviewed the triage vital signs and the nursing notes.  Pertinent labs & imaging results that were available during my care of the patient were reviewed by me and considered in my medical decision making (see chart for details).    COVID/Flu test pending. Symptom management warranted only.  Manage fever with Tylenol and ibuprofen.  Nasal symptoms with over-the-counter antihistamines recommended.  Treatment per discharge medications/discharge instructions.  Red flags/ER precautions given. The most current CDC isolation/quarantine recommendation advised.   Final Clinical Impressions(s) / UC Diagnoses   Final diagnoses:  Exposure to COVID-19 virus  Viral illness     Discharge Instructions      Your COVID 19 results should result within 2-4 days. Negative results are immediately resulted to Mychart. Positive results will receive a follow-up call from our clinic. If symptoms are present, I recommend home quarantine until results are known.  Alternate Tylenol and ibuprofen as needed for body aches and fever.  Symptom management per recommendations discussed today.  If any breathing difficulty or chest pain develops go immediately to the closest emergency department for evaluation.      ED Prescriptions     Medication Sig Dispense Auth. Provider   promethazine-dextromethorphan (PROMETHAZINE-DM) 6.25-15 MG/5ML syrup Take 5 mLs by mouth 3 (three) times daily as needed for cough. 140 mL Bing Neighbors, FNP      PDMP not reviewed this encounter.   Bing Neighbors, FNP 02/22/21 [phone]

## 2021-02-22 NOTE — ED Triage Notes (Signed)
Scratchy throat with fever and headache since yesterday, body aches.

## 2021-02-22 NOTE — Discharge Instructions (Signed)
Your COVID 19 results should result within 2-4 days. Negative results are immediately resulted to Mychart. Positive results will receive a follow-up call from our clinic. If symptoms are present, I recommend home quarantine until results are known.  Alternate Tylenol and ibuprofen as needed for body aches and fever.  Symptom management per recommendations discussed today.  If any breathing difficulty or chest pain develops go immediately to the closest emergency department for evaluation.  

## 2021-02-23 LAB — SARS-COV-2, NAA 2 DAY TAT

## 2021-02-23 LAB — NOVEL CORONAVIRUS, NAA: SARS-CoV-2, NAA: DETECTED — AB

## 2021-03-04 ENCOUNTER — Ambulatory Visit (INDEPENDENT_AMBULATORY_CARE_PROVIDER_SITE_OTHER): Payer: Medicaid Other | Admitting: Nurse Practitioner

## 2021-03-05 ENCOUNTER — Emergency Department (HOSPITAL_COMMUNITY)
Admission: EM | Admit: 2021-03-05 | Discharge: 2021-03-06 | Disposition: A | Discharge status: Home / Self Care | Payer: Medicaid Other | Attending: Emergency Medicine | Admitting: Emergency Medicine

## 2021-03-05 ENCOUNTER — Encounter (HOSPITAL_COMMUNITY): Payer: Self-pay

## 2021-03-05 ENCOUNTER — Encounter: Payer: Self-pay | Admitting: Emergency Medicine

## 2021-03-05 ENCOUNTER — Other Ambulatory Visit: Payer: Self-pay

## 2021-03-05 ENCOUNTER — Ambulatory Visit
Admission: EM | Admit: 2021-03-05 | Discharge: 2021-03-05 | Disposition: A | Discharge status: Home / Self Care | Payer: Medicaid Other | Attending: Emergency Medicine | Admitting: Emergency Medicine

## 2021-03-05 ENCOUNTER — Emergency Department (HOSPITAL_COMMUNITY): Payer: Medicaid Other

## 2021-03-05 DIAGNOSIS — R6889 Other general symptoms and signs: Secondary | ICD-10-CM | POA: Diagnosis not present

## 2021-03-05 DIAGNOSIS — R109 Unspecified abdominal pain: Secondary | ICD-10-CM | POA: Diagnosis present

## 2021-03-05 DIAGNOSIS — R110 Nausea: Secondary | ICD-10-CM | POA: Diagnosis not present

## 2021-03-05 DIAGNOSIS — R1084 Generalized abdominal pain: Secondary | ICD-10-CM

## 2021-03-05 DIAGNOSIS — R1010 Upper abdominal pain, unspecified: Secondary | ICD-10-CM | POA: Diagnosis not present

## 2021-03-05 DIAGNOSIS — R079 Chest pain, unspecified: Secondary | ICD-10-CM

## 2021-03-05 DIAGNOSIS — R0602 Shortness of breath: Secondary | ICD-10-CM

## 2021-03-05 DIAGNOSIS — R0789 Other chest pain: Secondary | ICD-10-CM | POA: Insufficient documentation

## 2021-03-05 DIAGNOSIS — Z8616 Personal history of COVID-19: Secondary | ICD-10-CM | POA: Insufficient documentation

## 2021-03-05 DIAGNOSIS — R112 Nausea with vomiting, unspecified: Secondary | ICD-10-CM | POA: Diagnosis not present

## 2021-03-05 LAB — POCT URINALYSIS DIP (MANUAL ENTRY)
Glucose, UA: NEGATIVE mg/dL
Ketones, POC UA: NEGATIVE mg/dL
Leukocytes, UA: NEGATIVE
Nitrite, UA: POSITIVE — AB
Protein Ur, POC: 30 mg/dL — AB
Spec Grav, UA: >=1.03 — AB (ref 1.010–1.025)
Urobilinogen, UA: 1 E.U./dL
pH, UA: 5.5 (ref 5.0–8.0)

## 2021-03-05 LAB — POCT URINE PREGNANCY: Preg Test, Ur: NEGATIVE

## 2021-03-05 MED ORDER — NITROFURANTOIN MONOHYD MACRO 100 MG PO CAPS: 100.0000 mg | ORAL_CAPSULE | Freq: Two times a day (BID) | ORAL | 0 refills | Status: DC

## 2021-03-05 MED ORDER — PREDNISONE 20 MG PO TABS: 20.0000 mg | ORAL_TABLET | Freq: Two times a day (BID) | ORAL | 0 refills | Status: AC

## 2021-03-05 MED ORDER — SODIUM CHLORIDE 0.9 % IV BOLUS
500.0000 mL | Freq: Once | INTRAVENOUS | Status: AC
  Administered 2021-03-05: 500 mL via INTRAVENOUS

## 2021-03-05 NOTE — ED Triage Notes (Signed)
Pt arrived via POV c/o abdominal pain. Pt was seen at [facility] earlier today for same. Pt was discharged and went home to sleep and did not pick up her medications that were sent to the pharmacy.

## 2021-03-05 NOTE — Discharge Instructions (Addendum)
Because we are a urgent care we are unable to rule out cardiac disease, blood clot, or stroke in urgent care setting.  Offered patient further evaluation and management in the ED.  Patient declines at this time and would like to try outpatient therapy first.  Aware of the risk associated with this decision including missed diagnosis, organ damage, organ failure, and/or death.  Patient aware and in agreement.     EKG without abnormality at this time Urine concerning for infection Antibiotic prescribed.  Take as directed and to completion.  This may help with your abdominal pain Prednisone for chest pain (do not take if breast feeding or pregnant) If you experience new or worsening symptoms return or go to ER such as fever, chills, nausea, vomiting, diarrhea, bloody or dark tarry stools, constipation, urinary symptoms, worsening abdominal discomfort, chest pain, shortness of breath, slurred speech, weakness, facial droop, symptoms that do not improve with medications, inability to keep fluids down, etc..Marland Kitchen

## 2021-03-05 NOTE — ED Provider Notes (Signed)
Emergency Department Provider Note   I have reviewed the triage vital signs and the nursing notes.   HISTORY  Chief Complaint Abdominal Pain   HPI Paige Fox is a 20 y.o. female with past medical history reviewed below returns to the emergency department for evaluation after experiencing abdominal pain with nausea over the past 2 to 3 days.  She recently recovered after getting COVID-19.  She reports lower and slightly left-sided abdominal discomfort worse after eating.  No diarrhea.  No vomiting but some nausea.  She went to urgent care earlier and was evaluated but on that presentation mention some chest discomfort as well as occasional word finding difficulty.  Patient states that the symptoms have been more subacute.  She has had 2 episodes of chest pain in the last week.  Each episode was very brief lasting less than a minute.  Episodes were burning in quality.  No other neuro symptoms other than occasional speech disturbance but that is not persistent.    History reviewed. No pertinent past medical history.  Patient Active Problem List   Diagnosis Date Noted   Encounter for initial prescription of contraceptive pills 04/21/2020   Urine pregnancy test negative 04/21/2020   VIRAL INFECTION, ACUTE 08/21/2009   TONSILLITIS, ACUTE 08/21/2009   ALLERGIC RHINITIS 06/24/2008    History reviewed. No pertinent surgical history.  Allergies Patient has no known allergies.  Family History  Problem Relation Age of Onset   Hypertension Maternal Grandmother    Heart disease Maternal Grandmother    Diabetes Mother    Cancer Mother     Social History Social History   Tobacco Use   Smoking status: Never   Smokeless tobacco: Never  Vaping Use   Vaping Use: Never used  Substance Use Topics   Alcohol use: No   Drug use: No    Review of Systems  Constitutional: No fever/chills Eyes: No visual changes. ENT: No sore throat. Cardiovascular: Positive occasional chest  pain. Respiratory: Denies shortness of breath. Gastrointestinal: Positive abdominal pain. Positive nausea, no vomiting.  No diarrhea.  No constipation. Genitourinary: Negative for dysuria. Musculoskeletal: Negative for back pain. Skin: Negative for rash. Neurological: Negative for headaches, focal weakness or numbness.  10-point ROS otherwise negative.  ____________________________________________   PHYSICAL EXAM:  VITAL SIGNS: ED Triage Vitals  Enc Vitals Group     BP 03/05/21 2101 134/82     Pulse Rate 03/05/21 2101 92     Resp 03/05/21 2101 18     Temp 03/05/21 2101 98.2 F (36.8 C)     Temp Source 03/05/21 2101 Oral     SpO2 03/05/21 2101 100 %     Weight 03/05/21 2102 264 lb 8.8 oz (120 kg)     Height 03/05/21 2102 5\' 2"  (1.575 m)    Constitutional: Alert and oriented. Well appearing and in no acute distress. Eyes: Conjunctivae are normal. PERRL.  Head: Atraumatic. Nose: No congestion/rhinnorhea. Mouth/Throat: Mucous membranes are moist. Neck: No stridor.   Cardiovascular: Normal rate, regular rhythm. Good peripheral circulation. Grossly normal heart sounds.   Respiratory: Normal respiratory effort.  No retractions. Lungs CTAB. Gastrointestinal: Soft and nontender. No RUQ tenderness or Murphy's sign. No distention.  Musculoskeletal: No lower extremity tenderness nor edema. No gross deformities of extremities. Neurologic:  Normal speech and language. No gross focal neurologic deficits are appreciated.  Skin:  Skin is warm, dry and intact. No rash noted.   ____________________________________________   LABS (all labs ordered are listed, but only abnormal  results are displayed)  Labs Reviewed  COMPREHENSIVE METABOLIC PANEL - Abnormal; Notable for the following components:      Result Value   Calcium 8.8 (*)    All other components within normal limits  CBC WITH DIFFERENTIAL/PLATELET - Abnormal; Notable for the following components:   WBC 12.4 (*)    Platelets  460 (*)    Neutro Abs 7.8 (*)    Abs Immature Granulocytes 0.08 (*)    All other components within normal limits  LIPASE, BLOOD  PREGNANCY, URINE  TROPONIN I (HIGH SENSITIVITY)  TROPONIN I (HIGH SENSITIVITY)   ____________________________________________  EKG   EKG Interpretation  Date/Time:  Friday March 05 2021 23:57:04 EDT Ventricular Rate:  71 PR Interval:  141 QRS Duration: 86 QT Interval:  376 QTC Calculation: 409 R Axis:   79 Text Interpretation: Sinus rhythm Confirmed by Alona Bene [phone]) on 03/06/2021 12:21:41 AM        ____________________________________________  RADIOLOGY  DG Chest Portable 1 View  Result Date: 03/05/2021 CLINICAL DATA:  Chest pain. EXAM: PORTABLE CHEST 1 VIEW COMPARISON:  None. FINDINGS: The heart size and mediastinal contours are within normal limits. Both lungs are clear. The visualized skeletal structures are unremarkable. IMPRESSION: No active disease. Electronically Signed   By: Elgie Collard M.D.   On: 03/05/2021 23:45    ____________________________________________   PROCEDURES  Procedure(s) performed:   Procedures  None  ____________________________________________   INITIAL IMPRESSION / ASSESSMENT AND PLAN / ED COURSE  Pertinent labs & imaging results that were available during my care of the patient were reviewed by me and considered in my medical decision making (see chart for details).   Patient presents emergency department with abdominal pain along with some occasional chest discomfort.  I reviewed the note from urgent care earlier today.  My suspicion for ACS, PE, stroke is exceedingly low.  Chest pain is very atypical for emergent cause of chest discomfort.  Do plan for chest x-ray along with screening EKG and troponin. Will obtain labs. No focal tenderness on abdominal exam. Do not plan on advanced abdominal imaging at this time.   01:05 AM  Troponin is within normal limits.  LFTs, bilirubin, lipase are also  within normal limits.  Pregnancy test and UA from earlier today at urgent care reviewed. Plan for symptom mgmt and will order an outpatient RUQ [place].  ____________________________________________  FINAL CLINICAL IMPRESSION(S) / ED DIAGNOSES  Final diagnoses:  Generalized abdominal pain  Nausea  Atypical chest pain     MEDICATIONS GIVEN DURING THIS VISIT:  Medications  sodium chloride 0.9 % bolus 500 mL (0 mLs Intravenous Stopped 03/06/21 0044)     NEW OUTPATIENT MEDICATIONS STARTED DURING THIS VISIT:  New Prescriptions   DICYCLOMINE (BENTYL) 20 MG TABLET    Take 1 tablet (20 mg total) by mouth 3 (three) times daily as needed.   ONDANSETRON (ZOFRAN ODT) 4 MG DISINTEGRATING TABLET    Take 1 tablet (4 mg total) by mouth every 8 (eight) hours as needed for nausea or vomiting.   PANTOPRAZOLE (PROTONIX) 40 MG TABLET    Take 1 tablet (40 mg total) by mouth daily.    Note:  This document was prepared using Dragon voice recognition software and may include unintentional dictation errors.  Alona Bene, MD, [facility]    Klarisa Barman, Arlyss Repress, MD 03/06/21 0111

## 2021-03-05 NOTE — ED Provider Notes (Signed)
[facility]   [id number] 03/05/21 Arrival Time: 1149  CC: multiple complaints  SUBJECTIVE:  Paige Fox is a 20 y.o. female who presents with complaint of lower abdomen x few days.  Denies a precipitating event, trauma, close contacts with similar symptoms, recent travel or antibiotic use.  Dx'ed with covid 2 weeks ago.  Localizes pain to lower abdomen.  Describes as intermittent and "difficult to described" in character.  Has not tried OTC medications.  Worse with movement.  Also reports mid chest pain that is intermittent and sharp associated with SOB.  Also mentions trouble forming words, or feeling foggy- however, this has been going on for the past year.  Worse recently since having covid  Denies fever, chills, diarrhea, constipation, hematochezia, melena, dysuria, difficulty urinating, increased frequency or urgency, flank pain, loss of bowel or bladder function, vaginal discharge, vaginal odor, vaginal bleeding, dyspareunia, pelvic pain.     Patient's last menstrual period was 02/16/2021 (exact date).  ROS: As per HPI.  All other pertinent ROS negative.     History reviewed. No pertinent past medical history. History reviewed. No pertinent surgical history. No Known Allergies No current facility-administered medications on file prior to encounter.   Current Outpatient Medications on File Prior to Encounter  Medication Sig Dispense Refill   Norethindrone-Ethinyl Estradiol-Fe Biphas (LO LOESTRIN FE) 1 MG-10 MCG / 10 MCG tablet Take 1 tablet by mouth daily. Take 1 daily by mouth 28 tablet 11   ibuprofen (ADVIL,MOTRIN) 800 MG tablet Take 1 tablet (800 mg total) by mouth 3 (three) times daily. 21 tablet 0   naproxen (NAPROSYN) 500 MG tablet Take 1 tablet (500 mg total) by mouth 2 (two) times daily. 30 tablet 0   promethazine-dextromethorphan (PROMETHAZINE-DM) 6.25-15 MG/5ML syrup Take 5 mLs by mouth 3 (three) times daily as needed for cough. 140 mL 0   triamcinolone cream  (KENALOG) 0.1 % Apply 1 application topically 2 (two) times daily. 30 g 0   Social History   Socioeconomic History   Marital status: Single    Spouse name: Not on file   Number of children: 0   Years of education: Not on file   Highest education level: Not on file  Occupational History   Not on file  Tobacco Use   Smoking status: Never   Smokeless tobacco: Never  Vaping Use   Vaping Use: Never used  Substance and Sexual Activity   Alcohol use: No   Drug use: No   Sexual activity: Yes    Birth control/protection: None  Other Topics Concern   Not on file  Social History Narrative   Not on file   Social Determinants of Health   Financial Resource Strain: Not on file  Food Insecurity: Not on file  Transportation Needs: Not on file  Physical Activity: Not on file  Stress: Not on file  Social Connections: Not on file  Intimate Partner Violence: Not on file   Family History  Problem Relation Age of Onset   Hypertension Maternal Grandmother    Heart disease Maternal Grandmother    Diabetes Mother    Cancer Mother      OBJECTIVE:  Vitals:   03/05/21 1212  BP: (!) 150/79  Pulse: (!) 107  Resp: 20  Temp: 98.5 F (36.9 C)  TempSrc: Oral  SpO2: 97%    General appearance: Alert; NAD HEENT: NCAT.  Oropharynx clear.  Lungs: clear to auscultation bilaterally without adventitious breath sounds Heart: regular rate and rhythm.   Abdomen:  soft, non-distended; normal active bowel sounds; non-tender to light and deep palpation; nontender at McBurney's point; no guarding Back: no CVA tenderness Extremities: no edema; symmetrical with no gross deformities Skin: warm and dry Neurologic: normal gait; CN 2-12 grossly intact Psychological: alert and cooperative; normal mood and affect  LABS: Results for orders placed or performed during the hospital encounter of 03/05/21 (from the past 24 hour(s))  POCT urinalysis dipstick     Status: Abnormal   Collection Time: 03/05/21  12:28 PM  Result Value Ref Range   Color, UA yellow yellow   Clarity, UA clear clear   Glucose, UA negative negative mg/dL   Bilirubin, UA small (A) negative   Ketones, POC UA negative negative mg/dL   Spec Grav, UA >=2.353 (A) 1.010 - 1.025   Blood, UA moderate (A) negative   pH, UA 5.5 5.0 - 8.0   Protein Ur, POC =30 (A) negative mg/dL   Urobilinogen, UA 1.0 0.2 or 1.0 E.U./dL   Nitrite, UA Positive (A) Negative   Leukocytes, UA Negative Negative  POCT urine pregnancy     Status: None   Collection Time: 03/05/21 12:28 PM  Result Value Ref Range   Preg Test, Ur Negative Negative   Ekg:  EKG normal sinus rhythm without ST elevations, depressions, or prolonged PR interval.  No narrowing or widening of the QRS complexes.     ASSESSMENT & PLAN:  1. Multiple complaints   2. Generalized abdominal pain   3. Chest pain, unspecified type   4. SOB (shortness of breath)     Meds ordered this encounter  Medications   nitrofurantoin, macrocrystal-monohydrate, (MACROBID) 100 MG capsule    Sig: Take 1 capsule (100 mg total) by mouth 2 (two) times daily.    Dispense:  10 capsule    Refill:  0    Order Specific Question:   Supervising Provider    Answer:   Eustace Moore [[id number]]   predniSONE (DELTASONE) 20 MG tablet    Sig: Take 1 tablet (20 mg total) by mouth 2 (two) times daily with a meal for 5 days.    Dispense:  10 tablet    Refill:  0    Order Specific Question:   Supervising Provider    Answer:   Eustace Moore [[id number]]   Because we are a urgent care we are unable to rule out cardiac disease, blood clot, or stroke in urgent care setting.  Offered patient further evaluation and management in the ED.  Patient declines at this time and would like to try outpatient therapy first.  Aware of the risk associated with this decision including missed diagnosis, organ damage, organ failure, and/or death.  Patient aware and in agreement.     EKG without abnormality at this  time Urine concerning for infection Antibiotic prescribed.  Take as directed and to completion.  This may help with your abdominal pain Prednisone for chest pain If you experience new or worsening symptoms return or go to ER such as fever, chills, nausea, vomiting, diarrhea, bloody or dark tarry stools, constipation, urinary symptoms, worsening abdominal discomfort, symptoms that do not improve with medications, inability to keep fluids down, etc...  Reviewed expectations re: course of current medical issues. Questions answered. Outlined signs and symptoms indicating need for more acute intervention. Patient verbalized understanding. After Visit Summary given.   Rennis Harding, PA-C 03/05/21 1318

## 2021-03-05 NOTE — ED Triage Notes (Signed)
Pt presents today with c/o abdominal pain, headache and reports her "mind and mouth don't seem to want to work together. +nausea, -v/d.

## 2021-03-06 DIAGNOSIS — R0789 Other chest pain: Secondary | ICD-10-CM | POA: Diagnosis not present

## 2021-03-06 DIAGNOSIS — R1010 Upper abdominal pain, unspecified: Secondary | ICD-10-CM | POA: Diagnosis not present

## 2021-03-06 DIAGNOSIS — R112 Nausea with vomiting, unspecified: Secondary | ICD-10-CM | POA: Diagnosis not present

## 2021-03-06 LAB — COMPREHENSIVE METABOLIC PANEL
ALT: 33 U/L (ref 0–44)
AST: 19 U/L (ref 15–41)
Albumin: 4.2 g/dL (ref 3.5–5.0)
Alkaline Phosphatase: 70 U/L (ref 38–126)
Anion gap: 7 (ref 5–15)
BUN: 13 mg/dL (ref 6–20)
CO2: 25 mmol/L (ref 22–32)
Calcium: 8.8 mg/dL — ABNORMAL LOW (ref 8.9–10.3)
Chloride: 103 mmol/L (ref 98–111)
Creatinine, Ser: 0.83 mg/dL (ref 0.44–1.00)
GFR, Estimated: >60 mL/min (ref >60)
Glucose, Bld: 92 mg/dL (ref 70–99)
Potassium: 4 mmol/L (ref 3.5–5.1)
Sodium: 135 mmol/L (ref 135–145)
Total Bilirubin: 0.8 mg/dL (ref 0.3–1.2)
Total Protein: 8 g/dL (ref 6.5–8.1)

## 2021-03-06 LAB — CBC WITH DIFFERENTIAL/PLATELET
Abs Immature Granulocytes: 0.08 10*3/uL — ABNORMAL HIGH (ref 0.00–0.07)
Basophils Absolute: 0.1 10*3/uL (ref 0.0–0.1)
Basophils Relative: 1 %
Eosinophils Absolute: 0.2 10*3/uL (ref 0.0–0.5)
Eosinophils Relative: 1 %
HCT: 42.9 % (ref 36.0–46.0)
Hemoglobin: 14 g/dL (ref 12.0–15.0)
Immature Granulocytes: 1 %
Lymphocytes Relative: 27 %
Lymphs Abs: 3.3 10*3/uL (ref 0.7–4.0)
MCH: 29.4 pg (ref 26.0–34.0)
MCHC: 32.6 g/dL (ref 30.0–36.0)
MCV: 90.1 fL (ref 80.0–100.0)
Monocytes Absolute: 1 10*3/uL (ref 0.1–1.0)
Monocytes Relative: 8 %
Neutro Abs: 7.8 10*3/uL — ABNORMAL HIGH (ref 1.7–7.7)
Neutrophils Relative %: 62 %
Platelets: 460 10*3/uL — ABNORMAL HIGH (ref 150–400)
RBC: 4.76 MIL/uL (ref 3.87–5.11)
RDW: 12.6 % (ref 11.5–15.5)
WBC: 12.4 10*3/uL — ABNORMAL HIGH (ref 4.0–10.5)
nRBC: 0 % (ref 0.0–0.2)

## 2021-03-06 LAB — LIPASE, BLOOD: Lipase: 25 U/L (ref 11–51)

## 2021-03-06 LAB — TROPONIN I (HIGH SENSITIVITY): Troponin I (High Sensitivity): <2 ng/L (ref <18)

## 2021-03-06 LAB — PREGNANCY, URINE: Preg Test, Ur: NEGATIVE

## 2021-03-06 MED ORDER — DICYCLOMINE HCL 20 MG PO TABS: 20.0000 mg | ORAL_TABLET | Freq: Three times a day (TID) | ORAL | 0 refills | Status: DC | PRN

## 2021-03-06 MED ORDER — ONDANSETRON 4 MG PO TBDP: 4.0000 mg | ORAL_TABLET | Freq: Three times a day (TID) | ORAL | 0 refills | Status: DC | PRN

## 2021-03-06 MED ORDER — PANTOPRAZOLE SODIUM 40 MG PO TBEC: 40.0000 mg | DELAYED_RELEASE_TABLET | Freq: Every day | ORAL | 0 refills | Status: DC

## 2021-03-06 NOTE — Discharge Instructions (Addendum)
You have been seen in the Emergency Department (ED) for abdominal pain.  Your evaluation did not identify a clear cause of your symptoms but was generally reassuring.  Please follow up as instructed above regarding today's emergent visit and the symptoms that are bothering you.  Return to the ED if your abdominal pain worsens or fails to improve, you develop bloody vomiting, bloody diarrhea, you are unable to tolerate fluids due to vomiting, fever greater than 101, or other symptoms that concern you.    IMPORTANT PATIENT INSTRUCTIONS:  Your ED provider has recommended an Outpatient Ultrasound.  Please call [phone] to schedule an appointment.

## 2021-03-08 ENCOUNTER — Telehealth: Payer: Self-pay

## 2021-03-08 NOTE — Telephone Encounter (Signed)
Transition Care Management Unsuccessful Follow-up Telephone Call  Date of discharge and from where:  03/06/2021-[place]  Attempts:  1st Attempt  Reason for unsuccessful TCM follow-up call:  Left voice message

## 2021-03-09 NOTE — Telephone Encounter (Signed)
Transition Care Management Unsuccessful Follow-up Telephone Call  Date of discharge and from where:  03/06/2021 from [facility]  Attempts:  2nd Attempt  Reason for unsuccessful TCM follow-up call:  Left voice message

## 2021-03-10 NOTE — Telephone Encounter (Signed)
Transition Care Management Unsuccessful Follow-up Telephone Call  Date of discharge and from where:  03/06/2021-[place]   Attempts:  3rd Attempt  Reason for unsuccessful TCM follow-up call:  Left voice message

## 2021-06-30 ENCOUNTER — Ambulatory Visit (INDEPENDENT_AMBULATORY_CARE_PROVIDER_SITE_OTHER): Payer: Medicaid Other

## 2021-06-30 ENCOUNTER — Ambulatory Visit
Admission: EM | Admit: 2021-06-30 | Discharge: 2021-06-30 | Disposition: A | Discharge status: Home / Self Care | Payer: Medicaid Other | Attending: Family Medicine | Admitting: Family Medicine

## 2021-06-30 ENCOUNTER — Other Ambulatory Visit: Payer: Self-pay

## 2021-06-30 DIAGNOSIS — M533 Sacrococcygeal disorders, not elsewhere classified: Secondary | ICD-10-CM | POA: Diagnosis not present

## 2021-06-30 DIAGNOSIS — S3993XA Unspecified injury of pelvis, initial encounter: Secondary | ICD-10-CM | POA: Diagnosis not present

## 2021-06-30 MED ORDER — HYDROCODONE-ACETAMINOPHEN 5-325 MG PO TABS: 1.0000 | ORAL_TABLET | Freq: Four times a day (QID) | ORAL | 0 refills | Status: DC | PRN

## 2021-06-30 MED ORDER — DICLOFENAC SODIUM 75 MG PO TBEC: 75.0000 mg | DELAYED_RELEASE_TABLET | Freq: Two times a day (BID) | ORAL | 0 refills | Status: DC

## 2021-06-30 NOTE — Discharge Instructions (Addendum)

## 2021-06-30 NOTE — ED Triage Notes (Signed)
Pt reports pain between buttocks x 5 days after she fell down stairs. Tylenol and ibuprofen gives no relief.

## 2021-07-03 NOTE — ED Provider Notes (Signed)
[facility]   [id number] 06/30/21 Arrival Time: 1705  ASSESSMENT & PLAN:  1. Acute coccygeal pain    I have personally viewed the imaging studies ordered this visit. No coccyx/sacrum fx appreciated.  OTC symptom care as needed. Activities as tolerated.  Discharge Medication List as of 06/30/2021  6:40 PM     START taking these medications   Details  diclofenac (VOLTAREN) 75 MG EC tablet Take 1 tablet (75 mg total) by mouth 2 (two) times daily., Starting Wed 06/30/2021, Normal    HYDROcodone-acetaminophen (NORCO/VICODIN) 5-325 MG tablet Take 1 tablet by mouth every 6 (six) hours as needed for moderate pain or severe pain., Starting Wed 06/30/2021, Normal         Follow-up Information     Inc, [facility].   Specialty: Pediatrics Why: As needed. Contact information: [address] [place] [address] [phone]                 Reviewed expectations re: course of current medical issues. Questions answered. Outlined signs and symptoms indicating need for more acute intervention. Understanding verbalized. After Visit Summary given.   SUBJECTIVE: History from: patient. Paige Fox is a 20 y.o. female who reports: fall on stairs; now with persistent tailbone pain for few days. Ambulatory with pain. No extremity sensation changes or weakness. No abd pain. Normal bowel/bladder habits.  OBJECTIVE:  Vitals:   06/30/21 1734  BP: 125/82  Pulse: 80  Resp: 18  Temp: 98.9 F (37.2 C)  TempSrc: Oral  SpO2: 99%    General appearance: alert; no distress Eyes: PERRLA; EOMI; conjunctiva normal HENT: [place]; AT Lungs: speaks full sentences without difficulty; unlabored Back: very TTP over tailbone; no bruising Extremities: no edema Skin: warm and dry Neurologic: normal gait Psychological: alert and cooperative; normal mood and affect   Imaging: DG Sacrum/Coccyx  Result Date: 06/30/2021 CLINICAL DATA:  Trauma EXAM:  SACRUM AND COCCYX - 2+ VIEW COMPARISON:  None. FINDINGS: Frontal and lateral views of the sacrum and coccyx are obtained. No fractures. Sacroiliac joints are unremarkable. Soft tissues are grossly normal. IMPRESSION: 1. No acute displaced fracture. Electronically Signed   By: Sharlet Salina M.D.   On: 06/30/2021 18:26    No Known Allergies  History reviewed. No pertinent past medical history. Social History   Socioeconomic History   Marital status: Single    Spouse name: Not on file   Number of children: 0   Years of education: Not on file   Highest education level: Not on file  Occupational History   Not on file  Tobacco Use   Smoking status: Never   Smokeless tobacco: Never  Vaping Use   Vaping Use: Never used  Substance and Sexual Activity   Alcohol use: No   Drug use: No   Sexual activity: Yes    Birth control/protection: None  Other Topics Concern   Not on file  Social History Narrative   Not on file   Social Determinants of Health   Financial Resource Strain: Not on file  Food Insecurity: Not on file  Transportation Needs: Not on file  Physical Activity: Not on file  Stress: Not on file  Social Connections: Not on file  Intimate Partner Violence: Not on file   Family History  Problem Relation Age of Onset   Hypertension Maternal Grandmother    Heart disease Maternal Grandmother    Diabetes Mother    Cancer Mother    History reviewed. No pertinent  surgical history.   Mardella Layman, MD 07/03/21 1031

## 2021-08-30 ENCOUNTER — Encounter: Payer: Self-pay | Admitting: Adult Health

## 2021-08-30 ENCOUNTER — Ambulatory Visit (INDEPENDENT_AMBULATORY_CARE_PROVIDER_SITE_OTHER): Payer: Medicaid Other | Admitting: Adult Health

## 2021-08-30 ENCOUNTER — Other Ambulatory Visit (HOSPITAL_COMMUNITY)
Admission: RE | Admit: 2021-08-30 | Discharge: 2021-08-30 | Disposition: A | Discharge status: Home / Self Care | Payer: Medicaid Other | Source: Ambulatory Visit | Attending: Adult Health | Admitting: Adult Health

## 2021-08-30 ENCOUNTER — Other Ambulatory Visit: Payer: Self-pay

## 2021-08-30 VITALS — BP 131/89 | HR 103 | Ht 64.0 in | Wt 268.0 lb

## 2021-08-30 DIAGNOSIS — Z3202 Encounter for pregnancy test, result negative: Secondary | ICD-10-CM | POA: Diagnosis not present

## 2021-08-30 DIAGNOSIS — Z01419 Encounter for gynecological examination (general) (routine) without abnormal findings: Secondary | ICD-10-CM | POA: Diagnosis not present

## 2021-08-30 DIAGNOSIS — Z309 Encounter for contraceptive management, unspecified: Secondary | ICD-10-CM

## 2021-08-30 LAB — POCT URINE PREGNANCY: Preg Test, Ur: NEGATIVE

## 2021-08-30 MED ORDER — LO LOESTRIN FE 1 MG-10 MCG / 10 MCG PO TABS: 1.0000 | ORAL_TABLET | Freq: Every day | ORAL | 11 refills | Status: DC

## 2021-08-30 NOTE — Progress Notes (Signed)
Patient ID: Paige Fox, female   DOB: January 25, 2001, 21 y.o.   MRN: [id number] History of Present Illness: Paige Fox is a 21 year old white female,single, G0P0, in for first pap and well woman gyn exam and wants to get back on birth control, has been of OCs for about 3 months,    Current Medications, Allergies, Past Medical History, Past Surgical History, Family History and Social History were reviewed in [employer] record.     Review of Systems:  Patient denies any headaches, hearing loss, fatigue, blurred vision, shortness of breath, chest pain, abdominal pain, problems with bowel movements, urination, or intercourse. No joint pain or mood swings.    Physical Exam:BP 131/89 (BP Location: Right Arm, Patient Position: Sitting, Cuff Size: Large)    Pulse (!) 103    Ht 5\' 4"  (1.626 m)    Wt 268 lb (121.6 kg)    LMP 08/23/2021    BMI 46.00 kg/m  UPT is negative  General:  Well developed, well nourished, no acute distress Skin:  Warm and dry Neck:  Midline trachea, normal thyroid, good ROM, no lymphadenopathy Lungs; Clear to auscultation bilaterally Breast:  No dominant palpable mass, retraction, or nipple discharge,they are large and she has some acne, vs HS on both lower breasts  Cardiovascular: Regular rate and rhythm Abdomen:  Soft, non tender, no hepatosplenomegaly Pelvic:  External genitalia is normal in appearance, no lesions.  The vagina is normal in appearance. Urethra has no lesions or masses. The cervix is nulliparous, pap with GC/CHL performed.  Uterus is felt to be normal size, shape, and contour.  No adnexal masses or tenderness noted.Bladder is non tender, no masses felt. Rectal: Deferred Extremities/musculoskeletal:  No swelling or varicosities noted, no clubbing or cyanosis Psych:  No mood changes, alert and cooperative,seems happy AA is 0 Fall risk is low Depression screen [facility] 2/9 08/30/2021 09/07/2018  Decreased Interest 1 0  Down, Depressed, Hopeless 0 1   PHQ - 2 Score 1 1  Altered sleeping 2 -  Tired, decreased energy 1 -  Change in appetite 2 -  Feeling bad or failure about yourself  0 -  Trouble concentrating 0 -  Moving slowly or fidgety/restless 0 -  Suicidal thoughts 0 -  PHQ-9 Score 6 -    GAD 7 : Generalized Anxiety Score 08/30/2021  Nervous, Anxious, on Edge 0  Control/stop worrying 0  Worry too much - different things 0  Trouble relaxing 0  Restless 0  Easily annoyed or irritable 1  Afraid - awful might happen 0  Total GAD 7 Score 1    Upstream - 08/30/21 1157       Pregnancy Intention Screening   Does the patient want to become pregnant in the next year? No    Does the patient's partner want to become pregnant in the next year? No    Would the patient like to discuss contraceptive options today? No      Contraception Wrap Up   Current Method No Contraceptive Precautions    End Method Oral Contraceptive;Female Condom    Contraception Counseling Provided Yes            Examination chaperoned by Tish RN    Impression and Plan: 1. Encounter for physical examination, contraception, and Papanicolaou smear of cervix Pap sent Physical in 1 year Pap in 3 if normal She wants pills she was on before, given 1 pack lo Loestrin to start today, use condoms for a 1 pack,  and will send in Rx Meds ordered this encounter  Medications   Norethindrone-Ethinyl Estradiol-Fe Biphas (LO LOESTRIN FE) 1 MG-10 MCG / 10 MCG tablet    Sig: Take 1 tablet by mouth daily. Take 1 daily by mouth    Dispense:  28 tablet    Refill:  11    BIN [id number], PCN [id number], GRP [id number]    Order Specific Question:   Supervising Provider    Answer:   Duane Lope H [[id number]]     2. Negative pregnancy test

## 2021-08-31 LAB — CYTOLOGY - PAP
Adequacy: ABSENT
Chlamydia: NEGATIVE
Comment: NEGATIVE
Comment: NORMAL
Diagnosis: NEGATIVE
Neisseria Gonorrhea: NEGATIVE

## 2021-09-08 ENCOUNTER — Telehealth: Payer: Self-pay | Admitting: Adult Health

## 2021-09-08 NOTE — Telephone Encounter (Signed)
Patient wants to know if it's normal to start your period 9 days after starting birth control. She stated her period had stopped on the 12th before being put on the pill. Please advise.

## 2021-09-08 NOTE — Telephone Encounter (Signed)
Pt has recently started back on birth control. She started bleeding 9 days after starting birth control. She was off birth control for a few months. I advised it's normal to have irregular bleeding when starting a new birth control. Advised to try and stick with the same pill for at least 3 packs. Pt voiced understanding. JSY

## 2021-09-08 NOTE — Telephone Encounter (Signed)
Left message @ 3:07 pm. JSY 

## 2021-09-13 ENCOUNTER — Ambulatory Visit
Admission: EM | Admit: 2021-09-13 | Discharge: 2021-09-13 | Disposition: A | Discharge status: Home / Self Care | Payer: Medicaid Other | Attending: Urgent Care | Admitting: Urgent Care

## 2021-09-13 ENCOUNTER — Other Ambulatory Visit: Payer: Self-pay

## 2021-09-13 DIAGNOSIS — M778 Other enthesopathies, not elsewhere classified: Secondary | ICD-10-CM | POA: Diagnosis not present

## 2021-09-13 MED ORDER — NAPROXEN 500 MG PO TABS: 500.0000 mg | ORAL_TABLET | Freq: Two times a day (BID) | ORAL | 0 refills | Status: DC

## 2021-09-13 NOTE — ED Provider Notes (Signed)
°  [place]   MRN: [id number] DOB: June 28, 2001  Subjective:   Paige Fox is a 21 y.o. female presenting for 65-month history of persistent intermittent right wrist pain that extends into the hand and occasionally into the fingers.  Symptoms are worse when she works.  No swelling, trauma, bruising, wounds.  Patient works at a [employer] and has been doing a lot of hours.  She is primarily right-handed.  No current facility-administered medications for this encounter.  Current Outpatient Medications:    Norethindrone-Ethinyl Estradiol-Fe Biphas (LO LOESTRIN FE) 1 MG-10 MCG / 10 MCG tablet, Take 1 tablet by mouth daily. Take 1 daily by mouth, Disp: 28 tablet, Rfl: 11   No Known Allergies  Past Medical History:  Diagnosis Date   Anemia      History reviewed. No pertinent surgical history.  Family History  Problem Relation Age of Onset   Hypertension Maternal Grandmother    Heart disease Maternal Grandmother    Diabetes Mother    Cancer Mother     Social History   Tobacco Use   Smoking status: Never   Smokeless tobacco: Never  Vaping Use   Vaping Use: Never used  Substance Use Topics   Alcohol use: No   Drug use: Never    ROS   Objective:   Vitals: BP 126/82 (BP Location: Right Arm)    Pulse 87    Temp 98.4 F (36.9 C) (Oral)    Resp 18    LMP 09/07/2021 (Exact Date)    SpO2 99%   Physical Exam Constitutional:      General: She is not in acute distress.    Appearance: Normal appearance. She is well-developed. She is not ill-appearing, toxic-appearing or diaphoretic.  HENT:     Head: Normocephalic and atraumatic.     Nose: Nose normal.     Mouth/Throat:     Mouth: Mucous membranes are moist.  Eyes:     General: No scleral icterus.       Right eye: No discharge.        Left eye: No discharge.     Extraocular Movements: Extraocular movements intact.  Cardiovascular:     Rate and Rhythm: Normal rate.  Pulmonary:     Effort:  Pulmonary effort is normal.  Musculoskeletal:       Hands:     Comments: Full range of motion, strength 5/5.  No swelling, ecchymosis, bony deformities.  Skin:    General: Skin is warm and dry.  Neurological:     General: No focal deficit present.     Mental Status: She is alert and oriented to person, place, and time.  Psychiatric:        Mood and Affect: Mood normal.        Behavior: Behavior normal.    Assessment and Plan :   PDMP not reviewed this encounter.  1. Extensor tendinitis of hand   2. Right hand tendonitis    Suspect an extensor tendinitis, inflammatory process secondary to overuse.  Recommended resting, icing especially after her work shifts.  Use naproxen for pain and inflammation.  Imaging is not warranted based off of her exam, nature of her pain and lack of trauma.  Counseled patient on potential for adverse effects with medications prescribed/recommended today, ER and return-to-clinic precautions discussed, patient verbalized understanding.    Jaynee Eagles, [place] 09/13/21 1941

## 2021-09-13 NOTE — ED Triage Notes (Signed)
Pt reports on and off sharp, shooting pain in the right wrist x 3 months; pain in the left wrist. Pain is worse when lifting heavy objets.

## 2021-09-24 ENCOUNTER — Other Ambulatory Visit: Payer: Self-pay

## 2021-09-24 ENCOUNTER — Ambulatory Visit
Admission: EM | Admit: 2021-09-24 | Discharge: 2021-09-24 | Disposition: A | Discharge status: Home / Self Care | Payer: Medicaid Other | Attending: Family Medicine | Admitting: Family Medicine

## 2021-09-24 DIAGNOSIS — R490 Dysphonia: Secondary | ICD-10-CM | POA: Diagnosis not present

## 2021-09-24 DIAGNOSIS — J3489 Other specified disorders of nose and nasal sinuses: Secondary | ICD-10-CM

## 2021-09-24 DIAGNOSIS — R110 Nausea: Secondary | ICD-10-CM

## 2021-09-24 MED ORDER — ONDANSETRON 4 MG PO TBDP: 4.0000 mg | ORAL_TABLET | Freq: Three times a day (TID) | ORAL | 0 refills | Status: DC | PRN

## 2021-09-24 MED ORDER — CETIRIZINE HCL 10 MG PO TABS: 10.0000 mg | ORAL_TABLET | Freq: Every day | ORAL | 2 refills | Status: DC

## 2021-09-24 MED ORDER — FLUTICASONE PROPIONATE 50 MCG/ACT NA SUSP: 1.0000 | Freq: Two times a day (BID) | NASAL | 2 refills | Status: DC

## 2021-09-24 NOTE — ED Triage Notes (Signed)
Pt presents with c/o runny nose and nausea that began today with left side abdominal pain  ?

## 2021-09-24 NOTE — ED Provider Notes (Signed)
?[place] ? ? ? ?CSN: [id number] ?Arrival date & time: 09/24/21  1422 ? ? ?  ? ?History   ?Chief Complaint ?Chief Complaint  ?Patient presents with  ? Nausea  ? ? ?HPI ?Paige Fox is a 21 y.o. female.  ? ?Presenting today with rhinorrhea, nausea, left upper quadrant abdominal cramping pain, mild headache, sinus pressure started today.  She states the nausea has been off and on for months but worse today.  Denies fever, chills, body aches, chest pain, shortness of breath, vomiting, diarrhea.  So far not trying anything but some ibuprofen with minimal relief.  Multiple sick contacts recently but unsure with what.  History of seasonal allergies but has not been on medication for this in several years. ? ? ?Past Medical History:  ?Diagnosis Date  ? Anemia   ? ? ?Patient Active Problem List  ? Diagnosis Date Noted  ? Encounter for physical examination, contraception, and Papanicolaou smear of cervix 08/30/2021  ? Encounter for initial prescription of contraceptive pills 04/21/2020  ? Negative pregnancy test 04/21/2020  ? VIRAL INFECTION, ACUTE 08/21/2009  ? TONSILLITIS, ACUTE 08/21/2009  ? ALLERGIC RHINITIS 06/24/2008  ? ? ?History reviewed. No pertinent surgical history. ? ?OB History   ? ? Gravida  ?0  ? Para  ?0  ? Term  ?0  ? Preterm  ?0  ? AB  ?0  ? Living  ?0  ?  ? ? SAB  ?0  ? IAB  ?0  ? Ectopic  ?0  ? Multiple  ?0  ? Live Births  ?0  ?   ?  ?  ? ? ? ?Home Medications   ? ?Prior to Admission medications   ?Medication Sig Start Date End Date Taking? Authorizing Provider  ?cetirizine (ZYRTEC ALLERGY) 10 MG tablet Take 1 tablet (10 mg total) by mouth daily. 09/24/21  Yes Volney American, PA-C  ?fluticasone (FLONASE) 50 MCG/ACT nasal spray Place 1 spray into both nostrils 2 (two) times daily. 09/24/21  Yes Volney American, PA-C  ?ondansetron (ZOFRAN-ODT) 4 MG disintegrating tablet Take 1 tablet (4 mg total) by mouth every 8 (eight) hours as needed for nausea or vomiting. 09/24/21  Yes Volney American, PA-C  ?naproxen (NAPROSYN) 500 MG tablet Take 1 tablet (500 mg total) by mouth 2 (two) times daily with a meal. 09/13/21   Jaynee Eagles, PA-C  ?Norethindrone-Ethinyl Estradiol-Fe Biphas (LO LOESTRIN FE) 1 MG-10 MCG / 10 MCG tablet Take 1 tablet by mouth daily. Take 1 daily by mouth 08/30/21   Estill Dooms, NP  ? ? ?Family History ?Family History  ?Problem Relation Age of Onset  ? Hypertension Maternal Grandmother   ? Heart disease Maternal Grandmother   ? Diabetes Mother   ? Cancer Mother   ? ? ?Social History ?Social History  ? ?Tobacco Use  ? Smoking status: Never  ? Smokeless tobacco: Never  ?Vaping Use  ? Vaping Use: Never used  ?Substance Use Topics  ? Alcohol use: No  ? Drug use: Never  ? ? ? ?Allergies   ?Patient has no known allergies. ? ? ?Review of Systems ?Review of Systems ?Per HPI ? ?Physical Exam ?Triage Vital Signs ?ED Triage Vitals  ?Enc Vitals Group  ?   BP 09/24/21 1454 124/85  ?   Pulse Rate 09/24/21 1454 92  ?   Resp 09/24/21 1454 20  ?   Temp 09/24/21 1454 98.3 ?F (36.8 ?C)  ?   Temp src --   ?  SpO2 09/24/21 1454 97 %  ?   Weight --   ?   Height --   ?   Head Circumference --   ?   Peak Flow --   ?   Pain Score 09/24/21 1452 1  ?   Pain Loc --   ?   Pain Edu? --   ?   Excl. in [place]? --   ? ?No data found. ? ?Updated Vital Signs ?BP 124/85   Pulse 92   Temp 98.3 ?F (36.8 ?C)   Resp 20   LMP 09/07/2021 (Exact Date)   SpO2 97%  ? ?Visual Acuity ?Right Eye Distance:   ?Left Eye Distance:   ?Bilateral Distance:   ? ?Right Eye Near:   ?Left Eye Near:    ?Bilateral Near:    ? ?Physical Exam ?Vitals and nursing note reviewed.  ?Constitutional:   ?   Appearance: Normal appearance. She is not ill-appearing.  ?HENT:  ?   Head: Atraumatic.  ?   Right Ear: Tympanic membrane and external ear normal.  ?   Left Ear: Tympanic membrane and external ear normal.  ?   Nose: Rhinorrhea present.  ?   Mouth/Throat:  ?   Mouth: Mucous membranes are moist.  ?   Pharynx: No oropharyngeal exudate  or posterior oropharyngeal erythema.  ?Eyes:  ?   Extraocular Movements: Extraocular movements intact.  ?   Conjunctiva/sclera: Conjunctivae normal.  ?Cardiovascular:  ?   Rate and Rhythm: Normal rate and regular rhythm.  ?   Heart sounds: Normal heart sounds.  ?Pulmonary:  ?   Effort: Pulmonary effort is normal.  ?   Breath sounds: Normal breath sounds. No wheezing or rales.  ?Abdominal:  ?   General: Bowel sounds are normal. There is no distension.  ?   Palpations: Abdomen is soft.  ?   Tenderness: There is no abdominal tenderness. There is no right CVA tenderness, left CVA tenderness or guarding.  ?Musculoskeletal:     ?   General: Normal range of motion.  ?   Cervical back: Normal range of motion and neck supple.  ?Skin: ?   General: Skin is warm and dry.  ?Neurological:  ?   Mental Status: She is alert and oriented to person, place, and time.  ?Psychiatric:     ?   Mood and Affect: Mood normal.     ?   Thought Content: Thought content normal.     ?   Judgment: Judgment normal.  ? ? ? ?UC Treatments / Results  ?Labs ?(all labs ordered are listed, but only abnormal results are displayed) ?Labs Reviewed  ?COVID-19, FLU A+B NAA  ? ? ?EKG ? ? ?Radiology ?No results found. ? ?Procedures ?Procedures (including critical care time) ? ?Medications Ordered in UC ?Medications - No data to display ? ?Initial Impression / Assessment and Plan / UC Course  ?I have reviewed the triage vital signs and the nursing notes. ? ?Pertinent labs & imaging results that were available during my care of the patient were reviewed by me and considered in my medical decision making (see chart for details). ? ?  ? ?Unclear if viral versus allergic in nature.  COVID and flu testing pending, treat with Zyrtec, Flonase, Zofran for nausea and vomiting.  Discussed DayQuil, NyQuil and other supportive over-the-counter medications and home care.  Return for acutely worsening symptoms. ? ?Final Clinical Impressions(s) / UC Diagnoses  ? ?Final  diagnoses:  ?Rhinorrhea  ?Hoarseness  ?Nausea without  vomiting  ? ?Discharge Instructions   ?None ?  ? ?ED Prescriptions   ? ? Medication Sig Dispense Auth. Provider  ? ondansetron (ZOFRAN-ODT) 4 MG disintegrating tablet Take 1 tablet (4 mg total) by mouth every 8 (eight) hours as needed for nausea or vomiting. 20 tablet Volney American, [place]  ? fluticasone [facility]) 50 MCG/ACT nasal spray Place 1 spray into both nostrils 2 (two) times daily. 16 g Volney American, [place]  ? cetirizine (ZYRTEC ALLERGY) 10 MG tablet Take 1 tablet (10 mg total) by mouth daily. 30 tablet Volney American, [place]  ? ?  ? ?PDMP not reviewed this encounter. ?  ?Volney American, PA-C ?09/24/21 1551 ? ?

## 2021-09-26 LAB — COVID-19, FLU A+B NAA
Influenza A, NAA: NOT DETECTED
Influenza B, NAA: NOT DETECTED
SARS-CoV-2, NAA: NOT DETECTED

## 2021-12-17 ENCOUNTER — Ambulatory Visit
Admission: EM | Admit: 2021-12-17 | Discharge: 2021-12-17 | Disposition: A | Discharge status: Home / Self Care | Payer: Medicaid Other | Attending: Family Medicine | Admitting: Family Medicine

## 2021-12-17 ENCOUNTER — Encounter: Payer: Self-pay | Admitting: Emergency Medicine

## 2021-12-17 DIAGNOSIS — R1084 Generalized abdominal pain: Secondary | ICD-10-CM | POA: Diagnosis not present

## 2021-12-17 LAB — POCT URINALYSIS DIP (MANUAL ENTRY)
Bilirubin, UA: NEGATIVE
Glucose, UA: NEGATIVE mg/dL
Ketones, POC UA: NEGATIVE mg/dL
Leukocytes, UA: NEGATIVE
Nitrite, UA: NEGATIVE
Protein Ur, POC: 100 mg/dL — AB
Spec Grav, UA: >=1.03 — AB (ref 1.010–1.025)
Urobilinogen, UA: 1 E.U./dL
pH, UA: 5.5 (ref 5.0–8.0)

## 2021-12-17 LAB — POCT URINE PREGNANCY: Preg Test, Ur: NEGATIVE

## 2021-12-17 LAB — POCT FASTING CBG KUC MANUAL ENTRY: POCT Glucose (KUC): 82 mg/dL (ref 70–99)

## 2021-12-17 MED ORDER — ONDANSETRON 4 MG PO TBDP: 4.0000 mg | ORAL_TABLET | Freq: Three times a day (TID) | ORAL | 0 refills | Status: DC | PRN

## 2021-12-17 NOTE — ED Triage Notes (Signed)
Lower Abd pain that started today.  States she feels lightheaded.

## 2021-12-17 NOTE — ED Provider Notes (Signed)
[facility]    CSN: [id number] Arrival date & time: 12/17/21  1419      History   Chief Complaint No chief complaint on file.   HPI Paige Fox is a 21 y.o. female.   Presenting today with 1 day history of diffuse lower abdominal pain, waves of lightheadedness.  Denies fever, chills, nausea, vomiting, diarrhea, constipation, urinary symptoms.  Has not noticed any aggravating or alleviating factors at this time.  Tried some Midol with no relief.  Is due for her menstrual cycle but has not yet started per patient.   Past Medical History:  Diagnosis Date   Anemia     Patient Active Problem List   Diagnosis Date Noted   Encounter for physical examination, contraception, and Papanicolaou smear of cervix 08/30/2021   Encounter for initial prescription of contraceptive pills 04/21/2020   Negative pregnancy test 04/21/2020   VIRAL INFECTION, ACUTE 08/21/2009   TONSILLITIS, ACUTE 08/21/2009   ALLERGIC RHINITIS 06/24/2008    History reviewed. No pertinent surgical history.  OB History     Gravida  0   Para  0   Term  0   Preterm  0   AB  0   Living  0      SAB  0   IAB  0   Ectopic  0   Multiple  0   Live Births  0            Home Medications    Prior to Admission medications   Medication Sig Start Date End Date Taking? Authorizing Provider  cetirizine (ZYRTEC ALLERGY) 10 MG tablet Take 1 tablet (10 mg total) by mouth daily. 09/24/21   Particia Nearing, PA-C  fluticasone [facility]) 50 MCG/ACT nasal spray Place 1 spray into both nostrils 2 (two) times daily. 09/24/21   Particia Nearing, PA-C  naproxen (NAPROSYN) 500 MG tablet Take 1 tablet (500 mg total) by mouth 2 (two) times daily with a meal. 09/13/21   Wallis Bamberg, PA-C  Norethindrone-Ethinyl Estradiol-Fe Biphas (LO LOESTRIN FE) 1 MG-10 MCG / 10 MCG tablet Take 1 tablet by mouth daily. Take 1 daily by mouth 08/30/21   Cyril Mourning A, NP  ondansetron (ZOFRAN-ODT) 4 MG  disintegrating tablet Take 1 tablet (4 mg total) by mouth every 8 (eight) hours as needed for nausea or vomiting. 12/17/21   Particia Nearing, PA-C    Family History Family History  Problem Relation Age of Onset   Hypertension Maternal Grandmother    Heart disease Maternal Grandmother    Diabetes Mother    Cancer Mother     Social History Social History   Tobacco Use   Smoking status: Never   Smokeless tobacco: Never  Vaping Use   Vaping Use: Never used  Substance Use Topics   Alcohol use: No   Drug use: Never     Allergies   Patient has no known allergies.   Review of Systems Review of Systems Per HPI  Physical Exam Triage Vital Signs ED Triage Vitals [12/17/21 1424]  Enc Vitals Group     BP 130/87     Pulse Rate 90     Resp 18     Temp 98.5 F (36.9 C)     Temp Source Oral     SpO2 97 %     Weight      Height      Head Circumference      Peak Flow  Pain Score 2     Pain Loc      Pain Edu?      Excl. in [place]?    No data found.  Updated Vital Signs BP 130/87 (BP Location: Right Arm)   Pulse 90   Temp 98.5 F (36.9 C) (Oral)   Resp 18   LMP 11/15/2021 (Exact Date)   SpO2 97%   Visual Acuity Right Eye Distance:   Left Eye Distance:   Bilateral Distance:    Right Eye Near:   Left Eye Near:    Bilateral Near:     Physical Exam Vitals and nursing note reviewed.  Constitutional:      Appearance: Normal appearance. She is not ill-appearing.  HENT:     Head: Atraumatic.     Mouth/Throat:     Mouth: Mucous membranes are moist.     Pharynx: No posterior oropharyngeal erythema.  Eyes:     Extraocular Movements: Extraocular movements intact.     Conjunctiva/sclera: Conjunctivae normal.  Cardiovascular:     Rate and Rhythm: Normal rate and regular rhythm.     Heart sounds: Normal heart sounds.  Pulmonary:     Effort: Pulmonary effort is normal.     Breath sounds: Normal breath sounds.  Abdominal:     General: Bowel sounds are  normal. There is no distension.     Palpations: Abdomen is soft.     Tenderness: There is no abdominal tenderness. There is no right CVA tenderness, left CVA tenderness or guarding.  Musculoskeletal:        General: Normal range of motion.     Cervical back: Normal range of motion and neck supple.  Skin:    General: Skin is warm and dry.  Neurological:     Mental Status: She is alert and oriented to person, place, and time.  Psychiatric:        Mood and Affect: Mood normal.        Thought Content: Thought content normal.        Judgment: Judgment normal.     UC Treatments / Results  Labs (all labs ordered are listed, but only abnormal results are displayed) Labs Reviewed  POCT URINALYSIS DIP (MANUAL ENTRY) - Abnormal; Notable for the following components:      Result Value   Clarity, UA cloudy (*)    Spec Grav, UA >=1.030 (*)    Blood, UA large (*)    Protein Ur, POC =100 (*)    All other components within normal limits  CBC WITH DIFFERENTIAL/PLATELET  COMPREHENSIVE METABOLIC PANEL  LIPASE  POCT URINE PREGNANCY  POCT FASTING CBG [facility] MANUAL ENTRY    EKG   Radiology No results found.  Procedures Procedures (including critical care time)  Medications Ordered in UC Medications - No data to display  Initial Impression / Assessment and Plan / UC Course  I have reviewed the triage vital signs and the nursing notes.  Pertinent labs & imaging results that were available during my care of the patient were reviewed by me and considered in my medical decision making (see chart for details).     Vital signs and exam overall benign and reassuring today with no red flag findings.  Random CBG 86, CBC pending.  Unable to obtain enough blood specimen for remaining test.  Urinalysis without evidence of urinary tract infection or pregnancy.  Discussed brat diet, fluids, Zofran and close monitoring.  ED for worsening symptoms.  Return if not improving.  Final Clinical  Impressions(s) / UC Diagnoses   Final diagnoses:  Generalized abdominal pain   Discharge Instructions   None    ED Prescriptions     Medication Sig Dispense Auth. Provider   ondansetron (ZOFRAN-ODT) 4 MG disintegrating tablet Take 1 tablet (4 mg total) by mouth every 8 (eight) hours as needed for nausea or vomiting. 20 tablet Particia Nearing, [place]      PDMP not reviewed this encounter.   Particia Nearing, [place] 12/17/21 1600

## 2021-12-18 LAB — CBC WITH DIFFERENTIAL/PLATELET
Basophils Absolute: 0 10*3/uL (ref 0.0–0.2)
Basos: 0 %
EOS (ABSOLUTE): 0.2 10*3/uL (ref 0.0–0.4)
Eos: 2 %
Hematocrit: 40.9 % (ref 34.0–46.6)
Hemoglobin: 13.9 g/dL (ref 11.1–15.9)
Immature Grans (Abs): 0 10*3/uL (ref 0.0–0.1)
Immature Granulocytes: 0 %
Lymphocytes Absolute: 3.3 10*3/uL — ABNORMAL HIGH (ref 0.7–3.1)
Lymphs: 29 %
MCH: 29 pg (ref 26.6–33.0)
MCHC: 34 g/dL (ref 31.5–35.7)
MCV: 85 fL (ref 79–97)
Monocytes Absolute: 0.9 10*3/uL (ref 0.1–0.9)
Monocytes: 8 %
Neutrophils Absolute: 6.9 10*3/uL (ref 1.4–7.0)
Neutrophils: 61 %
Platelets: 468 10*3/uL — ABNORMAL HIGH (ref 150–450)
RBC: 4.79 x10E6/uL (ref 3.77–5.28)
RDW: 12.3 % (ref 11.7–15.4)
WBC: 11.4 10*3/uL — ABNORMAL HIGH (ref 3.4–10.8)

## 2022-05-30 ENCOUNTER — Other Ambulatory Visit: Payer: Self-pay

## 2022-05-30 ENCOUNTER — Ambulatory Visit
Admission: RE | Admit: 2022-05-30 | Discharge: 2022-05-30 | Disposition: A | Discharge status: Home / Self Care | Payer: Medicaid Other | Source: Ambulatory Visit | Attending: Nurse Practitioner | Admitting: Nurse Practitioner

## 2022-05-30 DIAGNOSIS — L03032 Cellulitis of left toe: Secondary | ICD-10-CM

## 2022-05-30 MED ORDER — KETOROLAC TROMETHAMINE 30 MG/ML IJ SOLN
30.0000 mg | Freq: Once | INTRAMUSCULAR | Status: AC
  Administered 2022-05-30: 30 mg via INTRAMUSCULAR

## 2022-05-30 MED ORDER — DOXYCYCLINE HYCLATE 100 MG PO CAPS: 100.0000 mg | ORAL_CAPSULE | Freq: Two times a day (BID) | ORAL | 0 refills | Status: AC

## 2022-05-30 NOTE — ED Provider Notes (Signed)
[facility]    CSN: [id number] Arrival date & time: 05/30/22  1417      History   Chief Complaint Chief Complaint  Patient presents with   Toe Pain    HPI Paige Fox is a 21 y.o. female.   Patient presents with 1 day of left great toe "blister".  She endorses significant pain and redness around the toe and states "it did not look like that this morning.  No numbness or tingling in the toe, no recent injury to the toe.  No fever, oozing, drainage from the toe currently.     Past Medical History:  Diagnosis Date   Anemia     Patient Active Problem List   Diagnosis Date Noted   Encounter for physical examination, contraception, and Papanicolaou smear of cervix 08/30/2021   Encounter for initial prescription of contraceptive pills 04/21/2020   Negative pregnancy test 04/21/2020   VIRAL INFECTION, ACUTE 08/21/2009   TONSILLITIS, ACUTE 08/21/2009   ALLERGIC RHINITIS 06/24/2008    History reviewed. No pertinent surgical history.  OB History     Gravida  0   Para  0   Term  0   Preterm  0   AB  0   Living  0      SAB  0   IAB  0   Ectopic  0   Multiple  0   Live Births  0            Home Medications    Prior to Admission medications   Medication Sig Start Date End Date Taking? Authorizing Provider  doxycycline (VIBRAMYCIN) 100 MG capsule Take 1 capsule (100 mg total) by mouth 2 (two) times daily for 7 days. 05/30/22 06/06/22 Yes Valentino Nose, NP  cetirizine (ZYRTEC ALLERGY) 10 MG tablet Take 1 tablet (10 mg total) by mouth daily. 09/24/21   Particia Nearing, PA-C  fluticasone [facility]) 50 MCG/ACT nasal spray Place 1 spray into both nostrils 2 (two) times daily. 09/24/21   Particia Nearing, PA-C  naproxen (NAPROSYN) 500 MG tablet Take 1 tablet (500 mg total) by mouth 2 (two) times daily with a meal. 09/13/21   Wallis Bamberg, PA-C  Norethindrone-Ethinyl Estradiol-Fe Biphas (LO LOESTRIN FE) 1 MG-10 MCG / 10 MCG tablet  Take 1 tablet by mouth daily. Take 1 daily by mouth 08/30/21   Cyril Mourning A, NP  ondansetron (ZOFRAN-ODT) 4 MG disintegrating tablet Take 1 tablet (4 mg total) by mouth every 8 (eight) hours as needed for nausea or vomiting. 12/17/21   Particia Nearing, PA-C    Family History Family History  Problem Relation Age of Onset   Hypertension Maternal Grandmother    Heart disease Maternal Grandmother    Diabetes Mother    Cancer Mother     Social History Social History   Tobacco Use   Smoking status: Never   Smokeless tobacco: Never  Vaping Use   Vaping Use: Never used  Substance Use Topics   Alcohol use: No   Drug use: Never     Allergies   Patient has no known allergies.   Review of Systems Review of Systems Per HPI  Physical Exam Triage Vital Signs ED Triage Vitals [05/30/22 1507]  Enc Vitals Group     BP 105/70     Pulse Rate 91     Resp 20     Temp 98.2 F (36.8 C)     Temp Source Oral     SpO2  93 %     Weight      Height      Head Circumference      Peak Flow      Pain Score 10     Pain Loc      Pain Edu?      Excl. in [place]?    No data found.  Updated Vital Signs BP 105/70 (BP Location: Right Arm)   Pulse 91   Temp 98.2 F (36.8 C) (Oral)   Resp 20   LMP 05/29/2022 (Approximate)   SpO2 93%   Visual Acuity Right Eye Distance:   Left Eye Distance:   Bilateral Distance:    Right Eye Near:   Left Eye Near:    Bilateral Near:     Physical Exam Vitals and nursing note reviewed.  Constitutional:      General: She is not in acute distress.    Appearance: Normal appearance. She is not toxic-appearing.  HENT:     Mouth/Throat:     Mouth: Mucous membranes are moist.     Pharynx: Oropharynx is clear.  Cardiovascular:     Pulses:          Dorsalis pedis pulses are 2+ on the left side.  Pulmonary:     Effort: Pulmonary effort is normal. No respiratory distress.  Feet:     Left foot:     Skin integrity: Erythema and warmth present.      Comments: Paronychia to medial aspect of left great toe Skin:    General: Skin is warm and dry.     Capillary Refill: Capillary refill takes less than 2 seconds.     Coloration: Skin is not jaundiced or pale.     Findings: No erythema.  Neurological:     Mental Status: She is alert and oriented to person, place, and time.  Psychiatric:        Behavior: Behavior is cooperative.      UC Treatments / Results  Labs (all labs ordered are listed, but only abnormal results are displayed) Labs Reviewed - No data to display  EKG   Radiology No results found.  Procedures Incision and Drainage  Date/Time: 05/30/2022 3:54 PM  Performed by: Eulogio Bear, NP Authorized by: Eulogio Bear, NP   Consent:    Consent obtained:  Verbal   Consent given by:  Patient   Risks, benefits, and alternatives were discussed: yes     Risks discussed:  Bleeding and incomplete drainage   Alternatives discussed:  Alternative treatment Universal protocol:    Procedure explained and questions answered to patient or proxy's satisfaction: yes     Patient identity confirmed:  Verbally with patient Location:    Type:  Abscess   Location:  Lower extremity   Lower extremity location:  Toe   Toe location:  L big toe Pre-procedure details:    Skin preparation:  Antiseptic wash Sedation:    Sedation type:  None Anesthesia:    Anesthesia method:  Topical application   Topical anesthesia: freeze spray. Procedure type:    Complexity:  Simple Procedure details:    Incision types:  Stab incision   Incision depth:  Dermal   Drainage:  Purulent   Drainage amount:  Scant   Wound treatment:  Wound left open   Packing materials:  None Post-procedure details:    Procedure completion:  Tolerated  (including critical care time)  Medications Ordered in UC Medications  ketorolac (TORADOL) 30 MG/ML injection 30 mg (  has no administration in time range)    Initial Impression / Assessment and  Plan / UC Course  I have reviewed the triage vital signs and the nursing notes.  Pertinent labs & imaging results that were available during my care of the patient were reviewed by me and considered in my medical decision making (see chart for details).   Patient is well-appearing, normotensive, afebrile, not tachycardic, not tachypneic, oxygenating well on room air.    Paronychia of great toe, left I&D of paronychia as above Unable to fully drain paronychia secondary to pain We will cover patient with doxycycline twice daily for 7 days Discussed warm soaks at home Toradol 30 mg IM given today in urgent care for pain Continue Tylenol as needed for pain at home ER and return precautions discussed Note given for work  The patient was given the opportunity to ask questions.  All questions answered to their satisfaction.  The patient is in agreement to this plan.    Final Clinical Impressions(s) / UC Diagnoses   Final diagnoses:  Paronychia of great toe, left     Discharge Instructions      We drained some pus from your left big toe today.  There is still remaining infection around the toe nail.  Start warm soaks to help the infection drain out.  Start the doxycycline to treat underlying infection.  We have given you a shot of Toradol today to help with pain/inflammation.  You can also take Tylenol [phone] mg every 6 hours as needed for pain.  Follow up if the infection worsens despite treatment.    ED Prescriptions     Medication Sig Dispense Auth. Provider   doxycycline (VIBRAMYCIN) 100 MG capsule Take 1 capsule (100 mg total) by mouth 2 (two) times daily for 7 days. 14 capsule Eulogio Bear, NP      PDMP not reviewed this encounter.   Eulogio Bear, NP 05/30/22 [phone]

## 2022-05-30 NOTE — Discharge Instructions (Addendum)
We drained some pus from your left big toe today.  There is still remaining infection around the toe nail.  Start warm soaks to help the infection drain out.  Start the doxycycline to treat underlying infection.  We have given you a shot of Toradol today to help with pain/inflammation.  You can also take Tylenol [phone] mg every 6 hours as needed for pain.  Follow up if the infection worsens despite treatment.

## 2022-05-30 NOTE — ED Triage Notes (Signed)
Pt reports "blister" to left great toe and increased pain and difficulty walking ever since. Pt reports history of similar but reports "popped" on its own.

## 2022-09-01 ENCOUNTER — Other Ambulatory Visit: Payer: Self-pay

## 2022-09-01 ENCOUNTER — Other Ambulatory Visit: Payer: Self-pay | Admitting: Adult Health

## 2022-09-01 ENCOUNTER — Encounter: Payer: Self-pay | Admitting: Emergency Medicine

## 2022-09-01 ENCOUNTER — Ambulatory Visit
Admission: EM | Admit: 2022-09-01 | Discharge: 2022-09-01 | Disposition: A | Discharge status: Home / Self Care | Payer: Medicaid Other | Attending: Nurse Practitioner | Admitting: Nurse Practitioner

## 2022-09-01 DIAGNOSIS — R21 Rash and other nonspecific skin eruption: Secondary | ICD-10-CM

## 2022-09-01 MED ORDER — HYDROCORTISONE 1 % EX OINT: 1.0000 | TOPICAL_OINTMENT | Freq: Two times a day (BID) | CUTANEOUS | 0 refills | Status: AC

## 2022-09-01 NOTE — Discharge Instructions (Addendum)
You have a contact dermatitis. She have been prescribed hydrocortisone 1%.  [facility] for dermatology needs.

## 2022-09-01 NOTE — ED Triage Notes (Signed)
Pt reports was at work and reports rash developed on bilateral hands. Pt reports burning sensation ever since.

## 2022-09-01 NOTE — ED Provider Notes (Signed)
[facility]    CSN: [id number] Arrival date & time: 09/01/22  1455      History   Chief Complaint Chief Complaint  Patient presents with  . Rash    HPI Paige Fox is a 22 y.o. female.   HPI She is in today complaining of a rash to her hands/wrist. She reports that she noticed this while at work. 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 She reports that the soap has been the same for the last 5 yrs. She denies a history of allergies or asthma. Denies any new lotions, detergents, perfumes, foods or any other chemicals. She did use neosporin while at work. Denies headache, dizziness, visual changes, shortness of breath, dyspnea on exertion, chest pain, nausea, vomiting or any edema.   Past Medical History:  Diagnosis Date  . Anemia     Patient Active Problem List   Diagnosis Date Noted  . Encounter for physical examination, contraception, and Papanicolaou smear of cervix 08/30/2021  . Encounter for initial prescription of contraceptive pills 04/21/2020  . Negative pregnancy test 04/21/2020  . VIRAL INFECTION, ACUTE 08/21/2009  . TONSILLITIS, ACUTE 08/21/2009  . ALLERGIC RHINITIS 06/24/2008    History reviewed. No pertinent surgical history.  OB History     Gravida  0   Para  0   Term  0   Preterm  0   AB  0   Living  0      SAB  0   IAB  0   Ectopic  0   Multiple  0   Live Births  0            Home Medications    Prior to Admission medications   Medication Sig Start Date End Date Taking? Authorizing Provider  hydrocortisone 1 % ointment Apply 1 Application topically 2 (two) times daily. 09/01/22  Yes Vevelyn Francois, NP  LO LOESTRIN FE 1 MG-10 MCG / 10 MCG tablet TAKE 1 TABLET BY MOUTH DAILY 09/01/22   Estill Dooms, NP    Family History Family History  Problem Relation Age of Onset  . Hypertension Maternal Grandmother   . Heart disease Maternal Grandmother   . Diabetes  Mother   . Cancer Mother     Social History Social History   Tobacco Use  . Smoking status: Never  . Smokeless tobacco: Never  Vaping Use  . Vaping Use: Never used  Substance Use Topics  . Alcohol use: No  . Drug use: Never     Allergies   Patient has no known allergies.   Review of Systems Review of Systems   Physical Exam Triage Vital Signs ED Triage Vitals  Enc Vitals Group     BP 09/01/22 1601 114/82     Pulse Rate 09/01/22 1601 79     Resp 09/01/22 1601 20     Temp 09/01/22 1601 98.8 F (37.1 C)     Temp Source 09/01/22 1601 Oral     SpO2 09/01/22 1601 98 %     Weight --      Height --      Head Circumference --      Peak Flow --      Pain Score 09/01/22 1600 0     Pain Loc --      Pain Edu? --      Excl. in [place]? --    No data found.  Updated Vital Signs  BP 114/82 (BP Location: Right Arm)   Pulse 79   Temp 98.8 F (37.1 C) (Oral)   Resp 20   LMP 08/24/2022 (Approximate)   SpO2 98%   Visual Acuity Right Eye Distance:   Left Eye Distance:   Bilateral Distance:    Right Eye Near:   Left Eye Near:    Bilateral Near:     Physical Exam Constitutional:      General: She is not in acute distress.    Appearance: She is obese. She is not ill-appearing, toxic-appearing or diaphoretic.  HENT:     Head: Normocephalic and atraumatic.  Cardiovascular:     Rate and Rhythm: Normal rate.     Pulses: Normal pulses.  Skin:    Findings: Erythema and rash (bilateral hands/wrist) present.  Neurological:     Mental Status: She is alert.    UC Treatments / Results  Labs (all labs ordered are listed, but only abnormal results are displayed) Labs Reviewed - No data to display  EKG   Radiology No results found.  Procedures Procedures (including critical care time)  Medications Ordered in UC Medications - No data to display  Initial Impression / Assessment and Plan / UC Course  I have reviewed the triage vital signs and the nursing  notes.  Pertinent labs & imaging results that were available during my care of the patient were reviewed by me and considered in my medical decision making (see chart for details).     rash Final Clinical Impressions(s) / UC Diagnoses   Final diagnoses:  Rash     Discharge Instructions      You have a contact dermatitis. She have been prescribed hydrocortisone 1%.  [facility] for dermatology needs.      ED Prescriptions     Medication Sig Dispense Auth. Provider   hydrocortisone 1 % ointment Apply 1 Application topically 2 (two) times daily. 30 g Vevelyn Francois, NP      PDMP not reviewed this encounter.   Dionisio David [place], [place] 09/02/22 [phone]

## 2022-09-06 ENCOUNTER — Ambulatory Visit (INDEPENDENT_AMBULATORY_CARE_PROVIDER_SITE_OTHER): Payer: Medicaid Other | Admitting: Adult Health

## 2022-09-06 ENCOUNTER — Encounter: Payer: Self-pay | Admitting: Adult Health

## 2022-09-06 ENCOUNTER — Other Ambulatory Visit (HOSPITAL_COMMUNITY)
Admission: RE | Admit: 2022-09-06 | Discharge: 2022-09-06 | Disposition: A | Discharge status: Home / Self Care | Payer: Medicaid Other | Source: Ambulatory Visit | Attending: Adult Health | Admitting: Adult Health

## 2022-09-06 VITALS — BP 123/80 | HR 96 | Ht 65.0 in | Wt 270.0 lb

## 2022-09-06 DIAGNOSIS — Z3041 Encounter for surveillance of contraceptive pills: Secondary | ICD-10-CM

## 2022-09-06 DIAGNOSIS — N898 Other specified noninflammatory disorders of vagina: Secondary | ICD-10-CM | POA: Insufficient documentation

## 2022-09-06 DIAGNOSIS — Z113 Encounter for screening for infections with a predominantly sexual mode of transmission: Secondary | ICD-10-CM | POA: Diagnosis not present

## 2022-09-06 DIAGNOSIS — Z01419 Encounter for gynecological examination (general) (routine) without abnormal findings: Secondary | ICD-10-CM | POA: Diagnosis not present

## 2022-09-06 MED ORDER — LO LOESTRIN FE 1 MG-10 MCG / 10 MCG PO TABS: 1.0000 | ORAL_TABLET | Freq: Every day | ORAL | 4 refills | Status: AC

## 2022-09-06 NOTE — Progress Notes (Signed)
Patient ID: Paige Fox, female   DOB: 02/06/2001, 22 y.o.   MRN: [id number] History of Present Illness: Paige Fox is a 22 year old Hispanic female, with SO, G0P0, in for well woman gyn exam.  Last pap was negative 08/30/21.   Current Medications, Allergies, Past Medical History, Past Surgical History, Family History and Social History were reviewed in [employer] record.     Review of Systems: Patient denies any headaches, hearing loss, fatigue, blurred vision, shortness of breath, chest pain(has occasionally at work,training for new job), abdominal pain, problems with bowel movements, urination, or intercourse. No joint pain or mood swings.  Has vaginal discharge, no itching or odor   Physical Exam:BP 123/80 (BP Location: Left Arm, Patient Position: Sitting, Cuff Size: Large)   Pulse 96   Ht 5' 5"$  (1.651 m)   Wt 270 lb (122.5 kg)   LMP 08/24/2022 (Approximate)   BMI 44.93 kg/m   General:  Well developed, well nourished, no acute distress Skin:  Warm and dry Neck:  Midline trachea, normal thyroid, good ROM, no lymphadenopathy Lungs; Clear to auscultation bilaterally Breast:  No dominant palpable mass, retraction, or nipple discharge Cardiovascular: Regular rate and rhythm Abdomen:  Soft, non tender, no hepatosplenomegaly Pelvic:  External genitalia is normal in appearance, no lesions.  The vagina is normal in appearance,scant discharge, no odor. Urethra has no lesions or masses. The cervix is smooth.  Uterus is felt to be normal size, shape, and contour.  No adnexal masses or tenderness noted.Bladder is non tender, no masses felt. Extremities/musculoskeletal:  No swelling or varicosities noted, no clubbing or cyanosis Psych:  No mood changes, alert and cooperative,seems happy AA is 1 Fall risk is moderate    09/06/2022   11:42 AM 08/30/2021   11:57 AM 09/07/2018   10:00 AM  Depression screen PHQ 2/9  Decreased Interest 0 1 0  Down, Depressed, Hopeless 0 0 1  PHQ  - 2 Score 0 1 1  Altered sleeping 1 2   Tired, decreased energy 1 1   Change in appetite 1 2   Feeling bad or failure about yourself  0 0   Trouble concentrating 0 0   Moving slowly or fidgety/restless 0 0   Suicidal thoughts 0 0   PHQ-9 Score 3 6        09/06/2022   11:42 AM 08/30/2021   11:57 AM  GAD 7 : Generalized Anxiety Score  Nervous, Anxious, on Edge 0 0  Control/stop worrying 0 0  Worry too much - different things 0 0  Trouble relaxing 0 0  Restless 0 0  Easily annoyed or irritable 0 1  Afraid - awful might happen 0 0  Total GAD 7 Score 0 1      Upstream - 09/06/22 1147       Pregnancy Intention Screening   Does the patient want to become pregnant in the next year? Unsure    Does the patient's partner want to become pregnant in the next year? Unsure    Would the patient like to discuss contraceptive options today? No      Contraception Wrap Up   Current Method Oral Contraceptive    End Method Oral Contraceptive            Examination chaperoned by Levy Pupa LPN   Impression and Plan: 1. Encounter for well woman exam with routine gynecological exam Physical in 1 year Pap in 2026  2. Encounter for surveillance of contraceptive pills Happy  with lo loestrin, will refill Meds ordered this encounter  Medications   Norethindrone-Ethinyl Estradiol-Fe Biphas (LO LOESTRIN FE) 1 MG-10 MCG / 10 MCG tablet    Sig: Take 1 tablet by mouth daily.    Dispense:  84 tablet    Refill:  4    Order Specific Question:   Supervising Provider    Answer:   Elonda Husky, LUTHER H [[id number]]     3. Vaginal discharge CV swab sent   4. Screening examination for STD (sexually transmitted disease) CV swab sent for GC/CHL,trich,BV and yeast

## 2022-09-07 LAB — CERVICOVAGINAL ANCILLARY ONLY
Bacterial Vaginitis (gardnerella): NEGATIVE
Candida Glabrata: NEGATIVE
Candida Vaginitis: NEGATIVE
Chlamydia: NEGATIVE
Comment: NEGATIVE
Comment: NEGATIVE
Comment: NEGATIVE
Comment: NEGATIVE
Comment: NEGATIVE
Comment: NORMAL
Neisseria Gonorrhea: NEGATIVE
Trichomonas: NEGATIVE

## 2022-11-20 IMAGING — DX DG CHEST 1V PORT
1 series · 1 of 1 positions shown · non-contrast
Comparison: None.

CLINICAL DATA: Chest pain.

EXAM:
PORTABLE CHEST 1 VIEW

[chest ap grid]
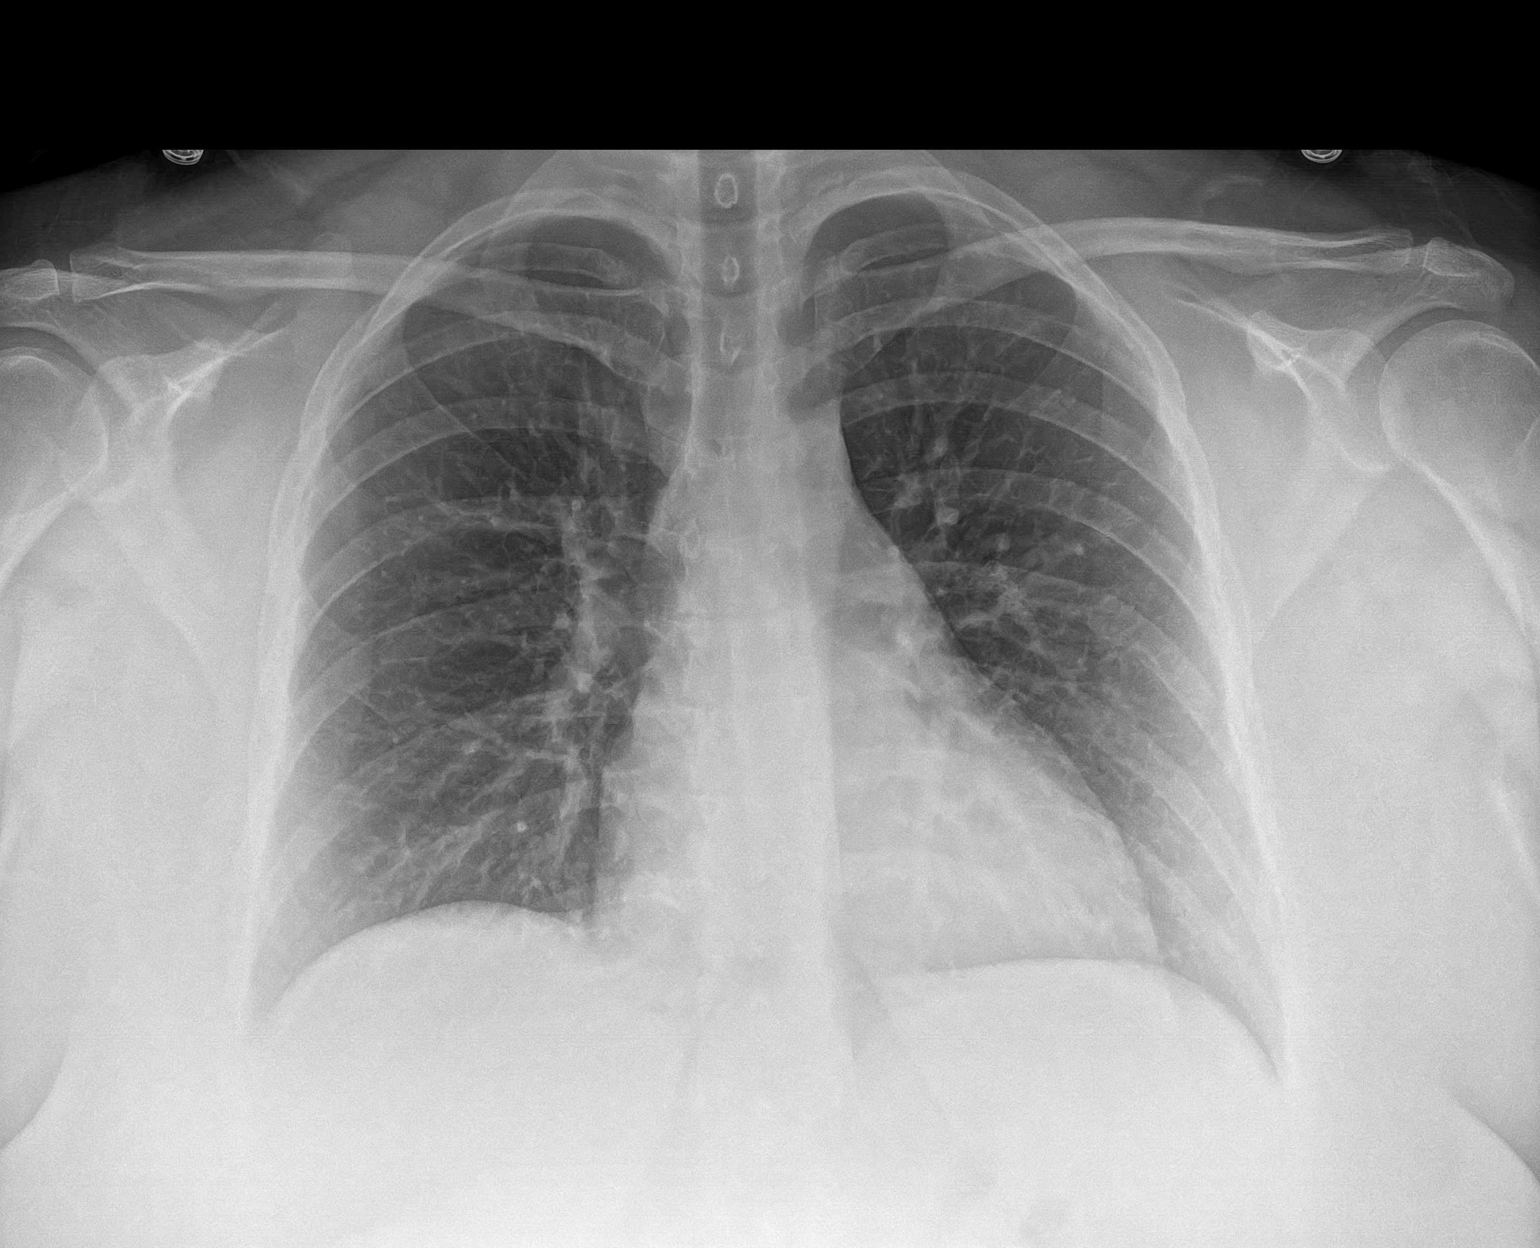

[1 of 1 positions shown; findings below may reference images not displayed]

FINDINGS: The heart size and mediastinal contours are within normal limits.
Both lungs are clear. The visualized skeletal structures are
unremarkable.
IMPRESSION: No active disease.

## 2023-05-30 ENCOUNTER — Encounter: Payer: Self-pay | Admitting: Emergency Medicine

## 2023-05-30 ENCOUNTER — Other Ambulatory Visit: Payer: Self-pay

## 2023-05-30 ENCOUNTER — Ambulatory Visit
Admission: EM | Admit: 2023-05-30 | Discharge: 2023-05-30 | Disposition: A | Discharge status: Home / Self Care | Payer: Medicaid Other

## 2023-05-30 DIAGNOSIS — R112 Nausea with vomiting, unspecified: Secondary | ICD-10-CM

## 2023-05-30 NOTE — ED Triage Notes (Addendum)
Pt reports emesis and headache since this am. Denies any known fevers, abd pain. Intermittent light senstivity.  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

## 2023-05-30 NOTE — ED Provider Notes (Signed)
[facility]    CSN: [id number] Arrival date & time: 05/30/23  0820      History   Chief Complaint Chief Complaint  Patient presents with   Emesis    HPI Paige Fox is a 22 y.o. female.   Patient presenting today with new onset nausea and vomiting that started around 2 AM this morning.  She states she has been unable to tolerate anything by mouth since onset.  Denies fever, chills, body aches, abdominal pain, diarrhea, upper respiratory symptoms, new foods or medications, recent travel.  Notes that her husband was sick with similar symptoms about 2 days ago.  So far not trying thing over-the-counter for symptoms as she states nausea medication makes it worse for her.  No known chronic GI issues.  LMP 05/17/2023.    Past Medical History:  Diagnosis Date   Anemia     Patient Active Problem List   Diagnosis Date Noted   Screening examination for STD (sexually transmitted disease) 09/06/2022   Vaginal discharge 09/06/2022   Encounter for surveillance of contraceptive pills 09/06/2022   Encounter for well woman exam with routine gynecological exam 09/06/2022   Encounter for physical examination, contraception, and Papanicolaou smear of cervix 08/30/2021   Encounter for initial prescription of contraceptive pills 04/21/2020   Negative pregnancy test 04/21/2020   VIRAL INFECTION, ACUTE 08/21/2009   TONSILLITIS, ACUTE 08/21/2009   Allergic rhinitis 06/24/2008    History reviewed. No pertinent surgical history.  OB History     Gravida  0   Para  0   Term  0   Preterm  0   AB  0   Living  0      SAB  0   IAB  0   Ectopic  0   Multiple  0   Live Births  0            Home Medications    Prior to Admission medications   Medication Sig Start Date End Date Taking? Authorizing Provider  hydrocortisone 1 % ointment Apply 1 Application topically 2 (two) times daily. 09/01/22   Barbette Merino, NP  Norethindrone-Ethinyl Estradiol-Fe Biphas (LO  LOESTRIN FE) 1 MG-10 MCG / 10 MCG tablet Take 1 tablet by mouth daily. 09/06/22   Adline Potter, NP    Family History Family History  Problem Relation Age of Onset   Hypertension Maternal Grandmother    Heart disease Maternal Grandmother    Diabetes Mother    Cancer Mother        blood    Social History Social History   Tobacco Use   Smoking status: Never   Smokeless tobacco: Never  Vaping Use   Vaping status: Never Used  Substance Use Topics   Alcohol use: Yes    Comment: rarely   Drug use: Never     Allergies   Patient has no known allergies.   Review of Systems Review of Systems Per HPI  Physical Exam Triage Vital Signs ED Triage Vitals  Encounter Vitals Group     BP 05/30/23 0858 109/76     Systolic BP Percentile --      Diastolic BP Percentile --      Pulse Rate 05/30/23 0858 72     Resp 05/30/23 0858 20     Temp 05/30/23 0858 98.5 F (36.9 C)     Temp Source 05/30/23 0858 Oral     SpO2 05/30/23 0858 98 %     Weight --  Height --      Head Circumference --      Peak Flow --      Pain Score 05/30/23 0856 6     Pain Loc --      Pain Education --      Exclude from Growth Chart --    No data found.  Updated Vital Signs BP 109/76 (BP Location: Right Arm)   Pulse 72   Temp 98.5 F (36.9 C) (Oral)   Resp 20   LMP 05/17/2023 (Approximate)   SpO2 98%   Visual Acuity Right Eye Distance:   Left Eye Distance:   Bilateral Distance:    Right Eye Near:   Left Eye Near:    Bilateral Near:     Physical Exam Vitals and nursing note reviewed.  Constitutional:      Appearance: Normal appearance. She is not ill-appearing.  HENT:     Head: Atraumatic.     Mouth/Throat:     Mouth: Mucous membranes are moist.     Pharynx: Oropharynx is clear. No posterior oropharyngeal erythema.  Eyes:     Extraocular Movements: Extraocular movements intact.     Conjunctiva/sclera: Conjunctivae normal.  Cardiovascular:     Rate and Rhythm: Normal rate  and regular rhythm.     Heart sounds: Normal heart sounds.  Pulmonary:     Effort: Pulmonary effort is normal.     Breath sounds: Normal breath sounds.  Abdominal:     General: Bowel sounds are normal. There is no distension.     Palpations: Abdomen is soft.     Tenderness: There is no abdominal tenderness. There is no right CVA tenderness, left CVA tenderness or guarding.  Musculoskeletal:        General: Normal range of motion.     Cervical back: Normal range of motion and neck supple.  Skin:    General: Skin is warm and dry.  Neurological:     Mental Status: She is alert and oriented to person, place, and time.  Psychiatric:        Mood and Affect: Mood normal.        Thought Content: Thought content normal.        Judgment: Judgment normal.      UC Treatments / Results  Labs (all labs ordered are listed, but only abnormal results are displayed) Labs Reviewed - No data to display  EKG   Radiology No results found.  Procedures Procedures (including critical care time)  Medications Ordered in UC Medications - No data to display  Initial Impression / Assessment and Plan / UC Course  I have reviewed the triage vital signs and the nursing notes.  Pertinent labs & imaging results that were available during my care of the patient were reviewed by me and considered in my medical decision making (see chart for details).     Vitals and exam very reassuring today with no red flag findings.  Suspect viral GI illness.  Offered Zofran, she declines today as she states it makes her worse.  Brat diet, fluids, rest.  Work note given.  Return for worsening symptoms.  Final Clinical Impressions(s) / UC Diagnoses   Final diagnoses:  Nausea and vomiting, unspecified vomiting type   Discharge Instructions   None    ED Prescriptions   None    PDMP not reviewed this encounter.   Paige Fox, [place] 05/30/23 [phone]

## 2023-11-05 ENCOUNTER — Encounter (HOSPITAL_COMMUNITY): Payer: Self-pay | Admitting: Emergency Medicine

## 2023-11-05 ENCOUNTER — Emergency Department (HOSPITAL_COMMUNITY)
Admission: EM | Admit: 2023-11-05 | Discharge: 2023-11-05 | Disposition: A | Discharge status: Home / Self Care | Payer: Self-pay | Attending: Emergency Medicine | Admitting: Emergency Medicine

## 2023-11-05 ENCOUNTER — Other Ambulatory Visit: Payer: Self-pay

## 2023-11-05 DIAGNOSIS — T162XXA Foreign body in left ear, initial encounter: Secondary | ICD-10-CM

## 2023-11-05 DIAGNOSIS — S00412A Abrasion of left ear, initial encounter: Secondary | ICD-10-CM | POA: Insufficient documentation

## 2023-11-05 DIAGNOSIS — X58XXXA Exposure to other specified factors, initial encounter: Secondary | ICD-10-CM | POA: Insufficient documentation

## 2023-11-05 MED ORDER — NEOMYCIN-POLYMYXIN-HC 1 % OT SOLN
4.0000 [drp] | OTIC | Status: DC
  Administered 2023-11-05: 4 [drp] via OTIC
  Filled 2023-11-05: qty 10

## 2023-11-05 NOTE — ED Triage Notes (Signed)
 Pt believes something crawled in her left ear.

## 2023-11-05 NOTE — ED Provider Notes (Signed)
 [place] EMERGENCY DEPARTMENT AT [facility] Provider Note   CSN: [id number] Arrival date & time: 11/05/23  2323     History  Chief Complaint  Patient presents with   Ear Pain   Foreign Body in Ear    Paige Fox is a 23 y.o. female.  Patient presents to the emergency department with concerns of a bug in her left ear.  She reports that she felt something crawling on her face and tried to swat it and it went into her ear.  She could feel it moving in the ear initially and there was some pain.  She tried to get it out with a Q-tip and a suction device.  There was some bleeding after this.       Home Medications Prior to Admission medications   Medication Sig Start Date End Date Taking? Authorizing Provider  hydrocortisone  1 % ointment Apply 1 Application topically 2 (two) times daily. 09/01/22   Gregoria Leas, NP  Norethindrone-Ethinyl Estradiol-Fe Biphas (LO LOESTRIN FE ) 1 MG-10 MCG / 10 MCG tablet Take 1 tablet by mouth daily. 09/06/22   Javan Messing, NP      Allergies    Patient has no known allergies.    Review of Systems   Review of Systems  Physical Exam Updated Vital Signs BP (!) 147/96   Pulse 85   Temp 98 F (36.7 C) (Oral)   Resp 17   Ht 5\' 5"  (1.651 m)   Wt 125 kg   LMP 10/19/2023   SpO2 98%   BMI 45.86 kg/m  Physical Exam Vitals and nursing note reviewed.  Constitutional:      Appearance: Normal appearance.  HENT:     Head: Normocephalic and atraumatic.     Left Ear: Hearing and tympanic membrane normal.     Ears:     Comments: Small abrasion of the ear canal at around 11:00, tympanic membrane is normal, no perforation.  No foreign body noted. Pulmonary:     Effort: Pulmonary effort is normal.  Skin:    Findings: No rash.  Neurological:     General: No focal deficit present.     Mental Status: She is alert and oriented to person, place, and time.     ED Results / Procedures / Treatments   Labs (all labs ordered are  listed, but only abnormal results are displayed) Labs Reviewed - No data to display  EKG None  Radiology No results found.  Procedures Procedures    Medications Ordered in ED Medications  NEOMYCIN -POLYMYXIN-HYDROCORTISONE  (CORTISPORIN ) OTIC (EAR) solution 4 drop (has no administration in time range)    ED Course/ Medical Decision Making/ A&P                                 Medical Decision Making  Dents with concerns of foreign body in ear.  It sounds like a bug climbed into her ear earlier today but when I examined her there is nothing in the canal.  There is a small amount of trauma to the canal that is not actively bleeding.  I did visualize approximately 95% of the tympanic membrane, there was a very tiny area around 7:00 that I could not visualize.  The area was therefore irrigated to ensure there was no foreign body and nothing came out.        Final Clinical Impression(s) / ED Diagnoses Final diagnoses:  FB  ear, left, initial encounter    Rx / DC Orders ED Discharge Orders     None         Ferguson Gertner, Marine Sia, MD 11/05/23 2345

## 2023-11-05 NOTE — ED Notes (Signed)
Went over d/c paperwork at this time with patient. Pt had no questions, comments or concerns after review and verbally understood them.

## 2023-11-05 NOTE — Discharge Instructions (Signed)
 Use the eardrops in the left ear 4 times a day for the next few days to prevent infection.

## 2023-12-31 ENCOUNTER — Ambulatory Visit
Admission: EM | Admit: 2023-12-31 | Discharge: 2023-12-31 | Disposition: A | Discharge status: Home / Self Care | Payer: Self-pay | Attending: Family Medicine | Admitting: Family Medicine

## 2023-12-31 DIAGNOSIS — R110 Nausea: Secondary | ICD-10-CM

## 2023-12-31 DIAGNOSIS — R197 Diarrhea, unspecified: Secondary | ICD-10-CM

## 2023-12-31 MED ORDER — LOPERAMIDE HCL 2 MG PO CAPS: 2.0000 mg | ORAL_CAPSULE | Freq: Four times a day (QID) | ORAL | 0 refills | Status: AC | PRN

## 2023-12-31 MED ORDER — ONDANSETRON 4 MG PO TBDP: 4.0000 mg | ORAL_TABLET | Freq: Three times a day (TID) | ORAL | 0 refills | Status: AC | PRN

## 2023-12-31 MED ORDER — PROMETHAZINE HCL 25 MG PO TABS: 25.0000 mg | ORAL_TABLET | Freq: Three times a day (TID) | ORAL | 0 refills | Status: AC | PRN

## 2023-12-31 NOTE — ED Provider Notes (Signed)
 [facility]    CSN: [id number] Arrival date & time: 12/31/23  4098      History   Chief Complaint No chief complaint on file.   HPI Paige Fox is a 23 y.o. female.   Patient presenting today with 2-day history of nausea, lightheadedness, diarrhea, fatigue.  Denies vomiting, fever, chills, body aches, chest pain, shortness of breath, upper respiratory symptoms, new foods or medications, recent travel outside the country, known sick contacts.  So far not trying anything over-the-counter for symptoms.  LMP 12/12/2023.    Past Medical History:  Diagnosis Date   Anemia     Patient Active Problem List   Diagnosis Date Noted   Screening examination for STD (sexually transmitted disease) 09/06/2022   Vaginal discharge 09/06/2022   Encounter for surveillance of contraceptive pills 09/06/2022   Encounter for well woman exam with routine gynecological exam 09/06/2022   Encounter for physical examination, contraception, and Papanicolaou smear of cervix 08/30/2021   Encounter for initial prescription of contraceptive pills 04/21/2020   Negative pregnancy test 04/21/2020   VIRAL INFECTION, ACUTE 08/21/2009   TONSILLITIS, ACUTE 08/21/2009   Allergic rhinitis 06/24/2008    History reviewed. No pertinent surgical history.  OB History     Gravida  0   Para  0   Term  0   Preterm  0   AB  0   Living  0      SAB  0   IAB  0   Ectopic  0   Multiple  0   Live Births  0            Home Medications    Prior to Admission medications   Medication Sig Start Date End Date Taking? Authorizing Provider  loperamide (IMODIUM) 2 MG capsule Take 1 capsule (2 mg total) by mouth 4 (four) times daily as needed for diarrhea or loose stools. 12/31/23  Yes Corbin Dess, PA-C  ondansetron  (ZOFRAN -ODT) 4 MG disintegrating tablet Take 1 tablet (4 mg total) by mouth every 8 (eight) hours as needed for nausea or vomiting. 12/31/23  Yes Corbin Dess, PA-C   promethazine  (PHENERGAN ) 25 MG tablet Take 1 tablet (25 mg total) by mouth every 8 (eight) hours as needed for nausea or vomiting. May cause drowsiness 12/31/23  Yes Corbin Dess, PA-C  hydrocortisone  1 % ointment Apply 1 Application topically 2 (two) times daily. 09/01/22   Gregoria Leas, NP  Norethindrone-Ethinyl Estradiol-Fe Biphas (LO LOESTRIN FE ) 1 MG-10 MCG / 10 MCG tablet Take 1 tablet by mouth daily. 09/06/22   Javan Messing, NP    Family History Family History  Problem Relation Age of Onset   Hypertension Maternal Grandmother    Heart disease Maternal Grandmother    Diabetes Mother    Cancer Mother        blood    Social History Social History   Tobacco Use   Smoking status: Never   Smokeless tobacco: Never  Vaping Use   Vaping status: Never Used  Substance Use Topics   Alcohol use: Yes    Comment: rarely   Drug use: Never     Allergies   Patient has no known allergies.   Review of Systems Review of Systems Per HPI  Physical Exam Triage Vital Signs ED Triage Vitals  Encounter Vitals Group     BP 12/31/23 1019 117/82     Girls Systolic BP Percentile --      Girls Diastolic BP  Percentile --      Boys Systolic BP Percentile --      Boys Diastolic BP Percentile --      Pulse Rate 12/31/23 1019 90     Resp 12/31/23 1019 20     Temp 12/31/23 1019 98.2 F (36.8 C)     Temp Source 12/31/23 1019 Oral     SpO2 12/31/23 1019 98 %     Weight --      Height --      Head Circumference --      Peak Flow --      Pain Score 12/31/23 1021 0     Pain Loc --      Pain Education --      Exclude from Growth Chart --    No data found.  Updated Vital Signs BP 117/82 (BP Location: Right Arm)   Pulse 90   Temp 98.2 F (36.8 C) (Oral)   Resp 20   LMP 12/12/2023 (Exact Date)   SpO2 98%   Visual Acuity Right Eye Distance:   Left Eye Distance:   Bilateral Distance:    Right Eye Near:   Left Eye Near:    Bilateral Near:     Physical  Exam Vitals and nursing note reviewed.  Constitutional:      Appearance: Normal appearance. She is not ill-appearing.  HENT:     Head: Atraumatic.   Eyes:     Extraocular Movements: Extraocular movements intact.     Conjunctiva/sclera: Conjunctivae normal.    Cardiovascular:     Rate and Rhythm: Normal rate and regular rhythm.     Heart sounds: Normal heart sounds.  Pulmonary:     Effort: Pulmonary effort is normal.     Breath sounds: Normal breath sounds.  Abdominal:     General: Abdomen is flat. There is no distension.     Palpations: Abdomen is soft. There is no mass.     Tenderness: There is no abdominal tenderness. There is no right CVA tenderness, left CVA tenderness or guarding.   Musculoskeletal:        General: Normal range of motion.     Cervical back: Normal range of motion and neck supple.   Skin:    General: Skin is warm and dry.   Neurological:     Mental Status: She is alert and oriented to person, place, and time.   Psychiatric:        Mood and Affect: Mood normal.        Thought Content: Thought content normal.        Judgment: Judgment normal.      UC Treatments / Results  Labs (all labs ordered are listed, but only abnormal results are displayed) Labs Reviewed - No data to display  EKG   Radiology No results found.  Procedures Procedures (including critical care time)  Medications Ordered in UC Medications - No data to display  Initial Impression / Assessment and Plan / UC Course  I have reviewed the triage vital signs and the nursing notes.  Pertinent labs & imaging results that were available during my care of the patient were reviewed by me and considered in my medical decision making (see chart for details).     Signs and exam benign and reassuring today with no red flag findings.  Suspect viral GI illness.  Treat with Phenergan , Zofran , Imodium as needed, brat diet, fluids, rest.  Work note given.  Return for any worsening  symptoms.  Final Clinical Impressions(s) / UC Diagnoses   Final diagnoses:  Nausea without vomiting  Diarrhea, unspecified type   Discharge Instructions   None    ED Prescriptions     Medication Sig Dispense Auth. Provider   promethazine  (PHENERGAN ) 25 MG tablet Take 1 tablet (25 mg total) by mouth every 8 (eight) hours as needed for nausea or vomiting. May cause drowsiness 15 tablet Corbin Dess, PA-C   ondansetron  (ZOFRAN -ODT) 4 MG disintegrating tablet Take 1 tablet (4 mg total) by mouth every 8 (eight) hours as needed for nausea or vomiting. 20 tablet Corbin Dess, PA-C   loperamide (IMODIUM) 2 MG capsule Take 1 capsule (2 mg total) by mouth 4 (four) times daily as needed for diarrhea or loose stools. 12 capsule Corbin Dess, [place]      PDMP not reviewed this encounter.   Corbin Dess, [place] 12/31/23 1142

## 2023-12-31 NOTE — ED Triage Notes (Signed)
 Pt reports nausea,lightheadedness,dizziness x 2 days and difficulty brething on set this morning.
